# Patient Record
Sex: Male | Born: 1970 | Race: White | Hispanic: No | Marital: Single | Smoking: Never smoker
Health system: Southern US, Community
[De-identification: ages and names within clinical notes are randomized; demographics above are authoritative.]

## PROBLEM LIST (undated history)

## (undated) DIAGNOSIS — I10 Essential (primary) hypertension: Secondary | ICD-10-CM

## (undated) DIAGNOSIS — E785 Hyperlipidemia, unspecified: Secondary | ICD-10-CM

## (undated) HISTORY — DX: Essential (primary) hypertension: I10

## (undated) HISTORY — DX: Hyperlipidemia, unspecified: E78.5

---

## 2007-08-29 ENCOUNTER — Emergency Department (HOSPITAL_COMMUNITY): Admission: EM | Admit: 2007-08-29 | Discharge: 2007-08-29 | Payer: Self-pay | Admitting: Family Medicine

## 2008-07-11 ENCOUNTER — Emergency Department (HOSPITAL_COMMUNITY): Admission: EM | Admit: 2008-07-11 | Discharge: 2008-07-11 | Payer: Self-pay | Admitting: Emergency Medicine

## 2009-06-09 ENCOUNTER — Ambulatory Visit: Payer: Self-pay | Admitting: Family Medicine

## 2009-06-09 DIAGNOSIS — I10 Essential (primary) hypertension: Secondary | ICD-10-CM | POA: Insufficient documentation

## 2009-06-09 DIAGNOSIS — R1319 Other dysphagia: Secondary | ICD-10-CM | POA: Insufficient documentation

## 2009-06-10 LAB — CONVERTED CEMR LAB
AST: 20 units/L (ref 0–37)
Albumin: 3.6 g/dL (ref 3.5–5.2)
Alkaline Phosphatase: 61 units/L (ref 39–117)
Bilirubin, Direct: 0 mg/dL (ref 0.0–0.3)
CO2: 28 meq/L (ref 19–32)
Calcium: 9 mg/dL (ref 8.4–10.5)
GFR calc non Af Amer: 98.86 mL/min (ref >60)
Glucose, Bld: 94 mg/dL (ref 70–99)
HDL: 30.8 mg/dL — ABNORMAL LOW (ref >39.00)
Potassium: 4.2 meq/L (ref 3.5–5.1)
Sodium: 139 meq/L (ref 135–145)
Total CHOL/HDL Ratio: 6
Total Protein: 7.6 g/dL (ref 6.0–8.3)
VLDL: 34 mg/dL (ref 0.0–40.0)

## 2009-06-15 ENCOUNTER — Encounter: Payer: Self-pay | Admitting: Family Medicine

## 2009-06-16 ENCOUNTER — Ambulatory Visit: Payer: Self-pay | Admitting: Otolaryngology

## 2009-07-13 ENCOUNTER — Ambulatory Visit: Payer: Self-pay | Admitting: Family Medicine

## 2009-07-27 ENCOUNTER — Encounter: Payer: Self-pay | Admitting: Family Medicine

## 2009-08-14 ENCOUNTER — Ambulatory Visit: Payer: Self-pay | Admitting: Family Medicine

## 2010-02-15 ENCOUNTER — Ambulatory Visit: Payer: Self-pay | Admitting: Family Medicine

## 2010-02-15 DIAGNOSIS — N63 Unspecified lump in unspecified breast: Secondary | ICD-10-CM | POA: Insufficient documentation

## 2010-02-15 DIAGNOSIS — S335XXA Sprain of ligaments of lumbar spine, initial encounter: Secondary | ICD-10-CM | POA: Insufficient documentation

## 2010-02-23 ENCOUNTER — Encounter: Payer: Self-pay | Admitting: Internal Medicine

## 2010-02-23 ENCOUNTER — Ambulatory Visit: Payer: Self-pay | Admitting: Family Medicine

## 2010-02-23 ENCOUNTER — Telehealth: Payer: Self-pay | Admitting: Family Medicine

## 2010-03-16 ENCOUNTER — Encounter: Payer: Self-pay | Admitting: Family Medicine

## 2010-03-31 ENCOUNTER — Encounter: Payer: Self-pay | Admitting: Family Medicine

## 2010-07-27 NOTE — Consult Note (Signed)
Summary: [facility]  [facility]   Imported By: Lanelle Bal 03/24/2010 09:58:52  _____________________________________________________________________  External Attachment:    Type:   Image     Comment:   External Document  Appended Document: [facility] biopsy of breast mass, results pending.

## 2010-07-27 NOTE — Consult Note (Signed)
Summary: [facility]  [facility]   Imported By: Lanelle Bal 07/03/2009 12:11:53  _____________________________________________________________________  External Attachment:    Type:   Image     Comment:   External Document

## 2010-07-27 NOTE — Letter (Signed)
Summary: [facility]  [facility]   Imported By: Lanelle Bal 08/10/2009 08:57:28  _____________________________________________________________________  External Attachment:    Type:   Image     Comment:   External Document

## 2010-07-27 NOTE — Assessment & Plan Note (Signed)
Summary: 1 MONTH FOLLOWUP BLOOD PRESSURE/[facility]   Vital Signs:  Patient profile:   40 year old male Height:      71 inches Weight:      438.13 pounds BMI:     61.33 Temp:     97.7 degrees F oral Pulse rate:   96 / minute Pulse rhythm:   regular BP sitting:   130 / 86  (left arm) Cuff size:   large  Vitals Entered By: Delilah Shan CMA Duncan Dull) (July 13, 2009 8:50 AM) CC: 1 month follow up - BP   History of Present Illness: 40 yo morbidly obese male here to follow up BP and Dysphagia.  Dysphagia- over last 5 years, feels like food gets stuck at times.  No problems with liquids.  No nausea, vomiting, or cough.  Does not feel like its getting worse, about the same as when it started.  Drinks occasional ETOH, does not smoke cigarettes.  Saw ENT, Dr. Willeen Cass, felt he was having silent reflux.  EGD was otherwise negative.  Started on Zantac which has alleviated this sensation.  Elevated BP- started Prinivil 10 mg daily at last office visit.    No blurred vision, CP, shortness of breath, DOE.  Has been having dry cough since starting it.  Not helped by Zantac.   Current Medications (verified): 1)  Hydrochlorothiazide 25 Mg  Tabs (Hydrochlorothiazide) .... Take 1 Tab By Mouth Every Morning  Allergies (verified): No Known Drug Allergies  Review of Systems      See HPI Eyes:  Denies blurring. CV:  Denies chest pain or discomfort. Resp:  Complains of cough; denies shortness of breath, sputum productive, and wheezing. GI:  Denies abdominal pain.  Physical Exam  General:  morbidly obese, alert, very pleasant. Eyes:  No corneal or conjunctival inflammation noted. EOMI. Perrla. Funduscopic exam benign, without hemorrhages, exudates or papilledema. Vision grossly normal. Mouth:  Oral mucosa and oropharynx without lesions or exudates.  Teeth in good repair. Lungs:  Normal respiratory effort, chest expands symmetrically. Lungs are clear to auscultation, no crackles or wheezes. Heart:   Distant HS RRR Extremities:  No edema Skin:  Intact without suspicious lesions or rashes Psych:  Cognition and judgment appear intact. Alert and cooperative with normal attention span and concentration. No apparent delusions, illusions, hallucinations   Impression & Recommendations:  Problem # 1:  HYPERTENSION (ICD-401.9) Assessment Improved Improved but having a possible allergy to ACEI.  Will add to his allergy list and change to HCTZ.  See him back in one month. The following medications were removed from the medication list:    Prinivil 10 Mg Tabs (Lisinopril) .Marland Kitchen... 1 tab by mouth daily. His updated medication list for this problem includes:    Hydrochlorothiazide 25 Mg Tabs (Hydrochlorothiazide) .Marland Kitchen... Take 1 tab by mouth every morning  Problem # 2:  OTHER DYSPHAGIA (ICD-787.29) Assessment: Improved Continue Zantac.  LIkely related to silent reflux per ENT.  Complete Medication List: 1)  Hydrochlorothiazide 25 Mg Tabs (Hydrochlorothiazide) .... Take 1 tab by mouth every morning Prescriptions: HYDROCHLOROTHIAZIDE 25 MG  TABS (HYDROCHLOROTHIAZIDE) Take 1 tab by mouth every morning  #90 x 3   Entered and Authorized by:   Ruthe Mannan MD   Signed by:   Ruthe Mannan MD on 07/13/2009   Method used:   [facility]  [address] [phone][facility]retail[facility]       [address]       [place], [place]  [address]       Ph: [phone] or [phone]       Fax: [phone]   RxID:   [id number]   Current Allergies (reviewed today): No known allergies

## 2010-07-27 NOTE — Progress Notes (Signed)
Summary: needs corrrected mammogram order  Phone Note From Other Clinic   Caller: Nettie Elm with [facility]   [phone] Summary of Call: Pt is going to mammogram this morning- the order they have needs to be corrected to say diagnostic bilateral mammogram, right breast ultrasound mass, dx code is correct.  They also need a clock postion of the mass.  Please send new mass.  Fax is [phone] Initial call taken by: Lowella Petties CMA,  February 23, 2010 9:13 AM  Follow-up for Phone Call        changed order. Ruthe Mannan MD  February 23, 2010 9:24 AM   Additional Follow-up for Phone Call Additional follow up Details #1::        Faxed corrected order to [facility]. Additional Follow-up by: Carlton Adam,  February 23, 2010 9:32 AM

## 2010-07-27 NOTE — Letter (Signed)
Summary: Mammography Form/[facility]  Mammography [facility]   Imported By: Lanelle Bal 04/06/2010 13:10:31  _____________________________________________________________________  External Attachment:    Type:   Image     Comment:   External Document

## 2010-07-27 NOTE — Assessment & Plan Note (Signed)
Summary: 1 M F/U DLO   Vital Signs:  Patient profile:   40 year old male Height:      71 inches Weight:      435.38 pounds BMI:     60.94 Temp:     98.1 degrees F oral Pulse rate:   88 / minute Pulse rhythm:   regular BP sitting:   134 / 86  (left arm) Cuff size:   large  Vitals Entered By: Delilah Shan CMA Duncan Dull) (August 14, 2009 8:55 AM) CC: follow-up visit   History of Present Illness: 40 yo morbidly obese male here to follow up BP and Dysphagia.  Dysphagia/cough- much improved since stopping the Prinivil and continuing Omeprazole.  Saw ENT at end of January, follow up with them prn. Has had no episodes of feeling like food getting stuck since last OV.  HTN- stopped  Prinivil 10 mg daily and added HCTZ 25 mg at last office visit due to possible ACEI allergy.    No blurred vision, CP, shortness of breath, DOE.  Dry cough completely resolved, allergy to ACEI now on allergy list.  Tolerating HCTZ with no known side effects.  Doing overall well, concerned about job situation.  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  Hopes they will let him transfer to another depeartment and/or work from home.  Current Medications (verified): 1)  Hydrochlorothiazide 25 Mg  Tabs (Hydrochlorothiazide) .... Take 1 Tab By Mouth Every Morning  Allergies (verified): 1)  ! Lisinopril  Review of Systems      See HPI General:  Denies sleep disorder. CV:  Denies chest pain or discomfort, lightheadness, shortness of breath with exertion, swelling of feet, and swelling of hands. Resp:  Denies cough.  Physical Exam  General:  morbidly obese, alert, very pleasant. Mouth:  Oral mucosa and oropharynx without lesions or exudates.  Teeth in good repair. Lungs:  Normal respiratory effort, chest expands symmetrically. Lungs are clear to auscultation, no crackles or wheezes. Heart:  Distant HS RRR Extremities:  No edema Psych:  Cognition and judgment appear intact. Alert and  cooperative with normal attention span and concentration. No apparent delusions, illusions, hallucinations   Impression & Recommendations:  Problem # 1:  HYPERTENSION (ICD-401.9) Assessment Unchanged Stable on HCTZ, now listed ACEI allergy.  His updated medication list for this problem includes:    Hydrochlorothiazide 25 Mg Tabs (Hydrochlorothiazide) .Marland Kitchen... Take 1 tab by mouth every morning  Problem # 2:  OTHER DYSPHAGIA (ICD-787.29) Assessment: Improved Resolved, likely combinations of reflux and allergy to ACEI.  Complete Medication List: 1)  Hydrochlorothiazide 25 Mg Tabs (Hydrochlorothiazide) .... Take 1 tab by mouth every morning Prescriptions: HYDROCHLOROTHIAZIDE 25 MG  TABS (HYDROCHLOROTHIAZIDE) Take 1 tab by mouth every morning  #90 x 6   Entered and Authorized by:   Ruthe Mannan MD   Signed by:   Ruthe Mannan MD on 08/14/2009   Method used:   [facility]        [facility]  [address] (retail)       [address]       [place], [place]  [address]       Ph: [phone] or [phone]       Fax: [phone]   RxID:   [id number]   Current Allergies (reviewed today): ! LISINOPRIL

## 2010-07-27 NOTE — Assessment & Plan Note (Signed)
Summary: 6 M F/U DLO   Vital Signs:  Patient profile:   40 year old male Height:      71 inches Weight:      134.50 pounds BMI:     18.83 Temp:     97.8 degrees F oral Pulse rate:   84 / minute Pulse rhythm:   regular BP sitting:   130 / 72  (right arm) Cuff size:   large  Vitals Entered By: Linde Gillis CMA Duncan Dull) (February 15, 2010 8:51 AM) CC: 6 month follow up   History of Present Illness: 40 yo morbidly obese male here for 6 month follow up.  HTN- stopped  Prinivil 10 mg daily and added HCTZ 25 mg due to possible ACEI allergy.    No blurred vision, CP, shortness of breath, DOE.  Dry cough completely resolved, allergy to ACEI now on allergy list.  Tolerating HCTZ with no known side effects.  Breast mass- noticed a painful mass under his right nipple a few months ago.  Feels it is growing in size.  No changes or discharge from his nipples.  Weight gain- continues to gain weight.  Thinking about lap band but needs more time.  Trying to work on portion control.  Lumbar strain- strained his back a few weeks ago by turning quickily a certain way.  Symptoms improved but at night, still very tight lower back.  Radicular symptoms have resolved.  No urinary symtpoms.  Current Medications (verified): 1)  Hydrochlorothiazide 25 Mg  Tabs (Hydrochlorothiazide) .... Take 1 Tab By Mouth Every Morning 2)  Cyclobenzaprine Hcl 10 Mg  Tabs (Cyclobenzaprine Hcl) .Marland Kitchen.. 1 By Mouth 2 Times Daily As Needed For Back Pain  Allergies: 1)  ! Lisinopril  Past History:  Past Medical History: Last updated: 06/09/2009 Morbid Obesity. Hypertension  Past Surgical History: Last updated: 06/09/2009 Denies surgical history  Family History: Last updated: 06/09/2009 Mom - HTN Dad- HTN, h/o prostate CA at age 63  Social History: Last updated: 06/09/2009 Lives alone in [place].  Single. Never Smoked Alcohol use-yes Drug use-no Regular exercise-no  Risk Factors: Exercise: no  (06/09/2009)  Risk Factors: Smoking Status: never (06/09/2009)  Review of Systems      See HPI General:  Denies fever. Eyes:  Denies blurring. ENT:  Denies difficulty swallowing. CV:  Denies chest pain or discomfort. Resp:  Denies shortness of breath. MS:  Complains of low back pain and stiffness; denies loss of strength, muscle weakness, and thoracic pain. Neuro:  Denies visual disturbances.  Physical Exam  General:  morbidly obese, alert, very pleasant. Mouth:  Oral mucosa and oropharynx without lesions or exudates.  Teeth in good repair. Breasts:  R palpable mass under aerola (6 oclock), TTP.  no adenopathy, no nipple discharge, and R palpable mass.   Lungs:  Normal respiratory effort, chest expands symmetrically. Lungs are clear to auscultation, no crackles or wheezes. Heart:  Distant HS RRR Msk:  tight lumbar paraspinous muscles, SLR neg bilaterally, normal strength and sensation Extremities:  no edema Neurologic:  alert & oriented X3 and gait normal.   Psych:  Cognition and judgment appear intact. Alert and cooperative with normal attention span and concentration. No apparent delusions, illusions, hallucinations   Impression & Recommendations:  Problem # 1:  LUMP OR MASS IN BREAST (ICD-611.72) Assessment New New mass, will send for mammogram. Orders: Radiology Referral (Radiology)  Problem # 2:  HYPERTENSION (ICD-401.9) Assessment: Improved Stable on HCTZ, continue current dose. His updated medication list for this problem  includes:    Hydrochlorothiazide 25 Mg Tabs (Hydrochlorothiazide) .Marland Kitchen... Take 1 tab by mouth every morning  Problem # 3:  LUMBAR STRAIN, ACUTE (ICD-847.2) Assessment: New Discussed supportive care. Flexeril as needed at night as needed.  Complete Medication List: 1)  Hydrochlorothiazide 25 Mg Tabs (Hydrochlorothiazide) .... Take 1 tab by mouth every morning 2)  Cyclobenzaprine Hcl 10 Mg Tabs (Cyclobenzaprine hcl) .Marland Kitchen.. 1 by mouth 2 times daily  as needed for back pain  Patient Instructions: 1)  Please stop by to see Shirlee Limerick on your way out. 2)  Most patients (90%) with low back pain will improve with time ( 2-6 weeks). Keep active but avoid activities that are painful. Apply moist heat and/or ice to lower back several times a day.  Prescriptions: CYCLOBENZAPRINE HCL 10 MG  TABS (CYCLOBENZAPRINE HCL) 1 by mouth 2 times daily as needed for back pain  #20 x 0   Entered and Authorized by:   Ruthe Mannan MD   Signed by:   Ruthe Mannan MD on 02/15/2010   Method used:   [facility]        [facility]  [address] (retail)       [address]       [place], [place]  [address]       Ph: [phone] or [phone]       Fax: [phone]   RxID:   [id number]   Current Allergies (reviewed today): ! LISINOPRIL

## 2010-08-04 ENCOUNTER — Other Ambulatory Visit: Payer: Self-pay | Admitting: Family Medicine

## 2010-08-04 ENCOUNTER — Encounter: Payer: Self-pay | Admitting: Family Medicine

## 2010-08-04 ENCOUNTER — Encounter (INDEPENDENT_AMBULATORY_CARE_PROVIDER_SITE_OTHER): Payer: BC Managed Care – PPO | Admitting: Family Medicine

## 2010-08-04 DIAGNOSIS — E785 Hyperlipidemia, unspecified: Secondary | ICD-10-CM

## 2010-08-04 DIAGNOSIS — Z125 Encounter for screening for malignant neoplasm of prostate: Secondary | ICD-10-CM

## 2010-08-04 DIAGNOSIS — Z136 Encounter for screening for cardiovascular disorders: Secondary | ICD-10-CM

## 2010-08-04 DIAGNOSIS — Z0000 Encounter for general adult medical examination without abnormal findings: Secondary | ICD-10-CM

## 2010-08-04 DIAGNOSIS — N62 Hypertrophy of breast: Secondary | ICD-10-CM | POA: Insufficient documentation

## 2010-08-04 DIAGNOSIS — I10 Essential (primary) hypertension: Secondary | ICD-10-CM

## 2010-08-04 LAB — LIPID PANEL
Cholesterol: 206 mg/dL — ABNORMAL HIGH (ref 0–200)
Total CHOL/HDL Ratio: 5
Triglycerides: 171 mg/dL — ABNORMAL HIGH (ref 0.0–149.0)

## 2010-08-04 LAB — BASIC METABOLIC PANEL
BUN: 10 mg/dL (ref 6–23)
CO2: 27 mEq/L (ref 19–32)
Calcium: 9.2 mg/dL (ref 8.4–10.5)
Chloride: 105 mEq/L (ref 96–112)
Creatinine, Ser: 0.8 mg/dL (ref 0.4–1.5)

## 2010-08-12 NOTE — Assessment & Plan Note (Signed)
Summary: cpe jrt   Vital Signs:  Patient profile:   40 year old male Height:      71 inches Weight:      463.50 pounds BMI:     64.88 Temp:     98.5 degrees F oral Pulse rate:   102 / minute Pulse rhythm:   regular BP sitting:   130 / 80  (right arm) Cuff size:   large  Vitals Entered By: Linde Gillis CMA Duncan Dull) (August 04, 2010 8:28 AM) CC: complete physicial   History of Present Illness: 40 yo morbidly obese male here forCPE.    HTN- stable on HCTZ 25 mg daily.    No blurred vision, CP, shortness of breath, DOE.  Dry cough completely resolved, allergy to ACEI now on allergy list.  Tolerating HCTZ with no known side effects.  Breast mass- noticed a painful mass under his right nipple a few months ago. Underwent ultrasound and breast biopsy, benign gynecomastia.  Weight gain- continues to gain weight.  Thinking about lap band and is actually attending a seminar by [employer] next week.      Dad had Prostate CA later in life.  Pt denies any increased urinary frequency or urgency.  Current Medications (verified): 1)  Hydrochlorothiazide 25 Mg  Tabs (Hydrochlorothiazide) .... Take 1 Tab By Mouth Every Morning  Allergies: 1)  ! Lisinopril  Past History:  Past Medical History: Last updated: 06/09/2009 Morbid Obesity. Hypertension  Past Surgical History: Last updated: 06/09/2009 Denies surgical history  Family History: Last updated: 08/04/2010 Mom - HTN Dad- HTN, h/o prostate CA at age 7 Family History of Prostate CA 1st degree relative <50  Social History: Last updated: 06/09/2009 Lives alone in [place].  Single. Never Smoked Alcohol use-yes Drug use-no Regular exercise-no  Risk Factors: Exercise: no (06/09/2009)  Risk Factors: Smoking Status: never (06/09/2009)  Family History: Mom - HTN Dad- HTN, h/o prostate CA at age 62 Family History of Prostate CA 1st degree relative <50  Review of Systems      See HPI General:  Denies malaise. Eyes:  Denies  blurring. ENT:  Denies difficulty swallowing. CV:  Denies chest pain or discomfort. Resp:  Denies shortness of breath. GI:  Denies abdominal pain, bloody stools, and change in bowel habits. GU:  Denies incontinence, urinary frequency, and urinary hesitancy. MS:  Complains of low back pain. Derm:  Denies rash. Neuro:  Denies headaches. Psych:  Denies anxiety and depression. Endo:  Denies cold intolerance and heat intolerance. Heme:  Denies abnormal bruising and bleeding.  Physical Exam  General:  morbidly obese, alert, very pleasant. Head:  normocephalic, atraumatic, and no abnormalities observed.   Eyes:  vision grossly intact, pupils equal, and pupils round.   Ears:  R ear normal and L ear normal.   Nose:  no external deformity.   Mouth:  good dentition.   Lungs:  Normal respiratory effort, chest expands symmetrically. Lungs are clear to auscultation, no crackles or wheezes. Heart:  Distant HS RRR Abdomen:  obese, soft, NT Msk:  No deformity or scoliosis noted of thoracic or lumbar spine.   Extremities:  no edema Neurologic:  alert & oriented X3 and gait normal.   Skin:  Intact without suspicious lesions or rashes Psych:  Cognition and judgment appear intact. Alert and cooperative with normal attention span and concentration. No apparent delusions, illusions, hallucinations   Impression & Recommendations:  Problem # 1:  Preventive Health Care (ICD-V70.0) Reviewed preventive care protocols, scheduled due services, and  updated immunizations Discussed nutrition, exercise, diet, and healthy lifestyle.  FLP, BMET, PSA (family h/o prostate CA) today.  Problem # 2:  HYPERTENSION (ICD-401.9) Assessment: Unchanged stable on HCTZ 25 mg daily. His updated medication list for this problem includes:    Hydrochlorothiazide 25 Mg Tabs (Hydrochlorothiazide) .Marland Kitchen... Take 1 tab by mouth every morning  Problem # 3:  OBESITY, MORBID (ICD-278.01) Assessment: Deteriorated Continues to  deteriorate.  Pt will go to seminar for bariatric surgery and let me know about what he decides.  Complete Medication List: 1)  Hydrochlorothiazide 25 Mg Tabs (Hydrochlorothiazide) .... Take 1 tab by mouth every morning  Other Orders: TLB-Lipid Panel (80061-LIPID) TLB-BMP (Basic Metabolic Panel-BMET) (80048-METABOL) Venipuncture (16109) TLB-PSA (Prostate Specific Antigen) (84153-PSA)   Orders Added: 1)  TLB-Lipid Panel [80061-LIPID] 2)  TLB-BMP (Basic Metabolic Panel-BMET) [80048-METABOL] 3)  Venipuncture [60454] 4)  TLB-PSA (Prostate Specific Antigen) [84153-PSA] 5)  Est. Patient 18-39 years [99395]    Current Allergies (reviewed today): ! LISINOPRIL  Prevention & Chronic Care Immunizations   Influenza vaccine: Not documented    Tetanus booster: 06/09/2009: Tdap    Pneumococcal vaccine: Not documented  Other Screening   Smoking status: never  (06/09/2009)  Lipids   Total Cholesterol: 191  (06/09/2009)   Lipid panel action/deferral: Lipid Panel ordered   LDL: 126  (06/09/2009)   LDL Direct: Not documented   HDL: 30.80  (06/09/2009)   Triglycerides: 170.0  (06/09/2009)  Hypertension   Last Blood Pressure: 130 / 80  (08/04/2010)   Serum creatinine: 0.9  (06/09/2009)   BMP action: Ordered   Serum potassium 4.2  (06/09/2009)  Self-Management Support :    Hypertension self-management support: Not documented

## 2010-09-08 ENCOUNTER — Encounter: Payer: Self-pay | Admitting: *Deleted

## 2010-09-08 ENCOUNTER — Other Ambulatory Visit (INDEPENDENT_AMBULATORY_CARE_PROVIDER_SITE_OTHER): Payer: BC Managed Care – PPO

## 2010-09-08 ENCOUNTER — Other Ambulatory Visit: Payer: Self-pay | Admitting: Family Medicine

## 2010-09-08 DIAGNOSIS — E785 Hyperlipidemia, unspecified: Secondary | ICD-10-CM

## 2010-09-08 LAB — LIPID PANEL
HDL: 33.6 mg/dL — ABNORMAL LOW (ref >39.00)
LDL Cholesterol: 87 mg/dL (ref 0–99)
Total CHOL/HDL Ratio: 4
VLDL: 28.4 mg/dL (ref 0.0–40.0)

## 2010-09-08 LAB — BASIC METABOLIC PANEL
BUN: 12 mg/dL (ref 6–23)
CO2: 26 mEq/L (ref 19–32)
Chloride: 101 mEq/L (ref 96–112)
GFR: 138.13 mL/min (ref >60.00)
Glucose, Bld: 93 mg/dL (ref 70–99)
Potassium: 3.9 mEq/L (ref 3.5–5.1)
Sodium: 136 mEq/L (ref 135–145)

## 2010-09-08 LAB — HEPATIC FUNCTION PANEL
Bilirubin, Direct: 0.1 mg/dL (ref 0.0–0.3)
Total Bilirubin: 0.4 mg/dL (ref 0.3–1.2)

## 2010-09-24 ENCOUNTER — Other Ambulatory Visit: Payer: BC Managed Care – PPO

## 2010-10-06 ENCOUNTER — Encounter: Payer: Self-pay | Admitting: Family Medicine

## 2010-10-06 ENCOUNTER — Telehealth: Payer: Self-pay | Admitting: *Deleted

## 2010-10-06 NOTE — Telephone Encounter (Signed)
Pt has brought in a form for surgical clearance for lapband surgery.  Form is on your desk.

## 2010-10-06 NOTE — Telephone Encounter (Signed)
Patient advised that form and letter ready for pick up, he will stop by today or tomorrow.

## 2010-10-06 NOTE — Telephone Encounter (Signed)
Complete, in my box.

## 2010-10-25 ENCOUNTER — Ambulatory Visit: Payer: Self-pay

## 2010-10-25 DIAGNOSIS — Z0279 Encounter for issue of other medical certificate: Secondary | ICD-10-CM

## 2010-11-29 ENCOUNTER — Other Ambulatory Visit (INDEPENDENT_AMBULATORY_CARE_PROVIDER_SITE_OTHER): Payer: Self-pay | Admitting: Surgery

## 2010-12-08 ENCOUNTER — Ambulatory Visit (HOSPITAL_COMMUNITY)
Admission: RE | Admit: 2010-12-08 | Discharge: 2010-12-08 | Disposition: A | Discharge status: Home / Self Care | Payer: BC Managed Care – PPO | Source: Ambulatory Visit | Attending: Surgery | Admitting: Surgery

## 2010-12-08 DIAGNOSIS — Z01818 Encounter for other preprocedural examination: Secondary | ICD-10-CM | POA: Insufficient documentation

## 2010-12-08 DIAGNOSIS — K7689 Other specified diseases of liver: Secondary | ICD-10-CM | POA: Insufficient documentation

## 2010-12-08 DIAGNOSIS — E669 Obesity, unspecified: Secondary | ICD-10-CM | POA: Insufficient documentation

## 2010-12-08 DIAGNOSIS — M47814 Spondylosis without myelopathy or radiculopathy, thoracic region: Secondary | ICD-10-CM | POA: Insufficient documentation

## 2010-12-08 DIAGNOSIS — Z01810 Encounter for preprocedural cardiovascular examination: Secondary | ICD-10-CM | POA: Insufficient documentation

## 2010-12-14 ENCOUNTER — Ambulatory Visit (HOSPITAL_COMMUNITY)
Admission: RE | Admit: 2010-12-14 | Discharge: 2010-12-14 | Disposition: A | Discharge status: Home / Self Care | Payer: BC Managed Care – PPO | Source: Ambulatory Visit | Attending: Surgery | Admitting: Surgery

## 2010-12-14 DIAGNOSIS — Z6841 Body Mass Index (BMI) 40.0 and over, adult: Secondary | ICD-10-CM | POA: Insufficient documentation

## 2010-12-14 DIAGNOSIS — Z01818 Encounter for other preprocedural examination: Secondary | ICD-10-CM | POA: Insufficient documentation

## 2010-12-15 ENCOUNTER — Other Ambulatory Visit (INDEPENDENT_AMBULATORY_CARE_PROVIDER_SITE_OTHER): Payer: Self-pay | Admitting: Surgery

## 2010-12-15 LAB — CBC WITH DIFFERENTIAL/PLATELET
Eosinophils Absolute: 0.4 10*3/uL (ref 0.0–0.7)
Eosinophils Relative: 5 % (ref 0–5)
Lymphs Abs: 2.8 10*3/uL (ref 0.7–4.0)
MCH: 29.3 pg (ref 26.0–34.0)
MCHC: 33.5 g/dL (ref 30.0–36.0)
MCV: 87.6 fL (ref 78.0–100.0)
Platelets: 307 10*3/uL (ref 150–400)
RBC: 4.98 MIL/uL (ref 4.22–5.81)
RDW: 15.6 % — ABNORMAL HIGH (ref 11.5–15.5)

## 2010-12-15 LAB — COMPREHENSIVE METABOLIC PANEL
ALT: 29 U/L (ref 0–53)
CO2: 24 mEq/L (ref 19–32)
Creat: 0.84 mg/dL (ref 0.50–1.35)
Total Bilirubin: 0.4 mg/dL (ref 0.3–1.2)

## 2010-12-15 LAB — LIPID PANEL
Cholesterol: 172 mg/dL (ref 0–200)
Total CHOL/HDL Ratio: 4.4 Ratio
Triglycerides: 122 mg/dL (ref <150)
VLDL: 24 mg/dL (ref 0–40)

## 2010-12-15 LAB — TSH: TSH: 2.825 u[IU]/mL (ref 0.350–4.500)

## 2010-12-21 ENCOUNTER — Encounter: Payer: BC Managed Care – PPO | Attending: Surgery | Admitting: *Deleted

## 2010-12-21 ENCOUNTER — Ambulatory Visit: Payer: BC Managed Care – PPO | Admitting: *Deleted

## 2010-12-21 DIAGNOSIS — Z713 Dietary counseling and surveillance: Secondary | ICD-10-CM | POA: Insufficient documentation

## 2010-12-21 DIAGNOSIS — Z01818 Encounter for other preprocedural examination: Secondary | ICD-10-CM | POA: Insufficient documentation

## 2011-02-03 ENCOUNTER — Encounter: Payer: BC Managed Care – PPO | Attending: Surgery

## 2011-02-03 DIAGNOSIS — Z9884 Bariatric surgery status: Secondary | ICD-10-CM | POA: Insufficient documentation

## 2011-02-03 DIAGNOSIS — Z713 Dietary counseling and surveillance: Secondary | ICD-10-CM | POA: Insufficient documentation

## 2011-02-03 DIAGNOSIS — Z09 Encounter for follow-up examination after completed treatment for conditions other than malignant neoplasm: Secondary | ICD-10-CM | POA: Insufficient documentation

## 2011-02-03 NOTE — Progress Notes (Signed)
  Pre-Operative Nutrition Class  Patient was seen on 02/03/2011 for Pre-Operative Nutrition education at [facility].   Surgery date: 03/01/11 Surgery type: LAGB  Weight today: 430 lbs Weight change: 17.9 lbs lost Total weight lost: 17.9 lbs BMI: 59.9%  Samples given per MNT protocol: Bariatric Advantage Multivitamin Lot # [id number] Exp: 12/12  Bariatric Advantage Calcium Citrate Lot # [id number] Exp: 9/13  Celebrate Vitamins Multivitamin Lot # [id number] Exp: 9/13  Celebrate VitaminsCalcium Citrate Lot #[id number] Exp: 4/13  The following the learning objective met by the patient during this course:   Identifies Pre-Op Dietary Goals and will begin 2 weeks pre-operatively   Identifies appropriate sources of fluids and proteins   States protein recommendations and appropriate sources pre and post-operatively  Identifies Post-Operative Dietary Goals and will follow for 2 weeks post-operatively  Identifies appropriate multivitamin and calcium sources  Describes the need for physical activity post-operatively and will follow MD recommendations  States when to call healthcare provider regarding medication questions or post-operative complications  Follow-Up Plan: Patient will follow-up at [facility] 2 weeks post operatively for diet advancement per MD.

## 2011-02-15 ENCOUNTER — Encounter (INDEPENDENT_AMBULATORY_CARE_PROVIDER_SITE_OTHER): Payer: Self-pay | Admitting: General Surgery

## 2011-02-16 ENCOUNTER — Ambulatory Visit (INDEPENDENT_AMBULATORY_CARE_PROVIDER_SITE_OTHER): Payer: BC Managed Care – PPO | Admitting: Surgery

## 2011-02-16 ENCOUNTER — Encounter (INDEPENDENT_AMBULATORY_CARE_PROVIDER_SITE_OTHER): Payer: Self-pay | Admitting: Surgery

## 2011-02-16 NOTE — Progress Notes (Signed)
Subjective:     Patient ID: Timothy Delacruz, male   DOB: 11/06/1970, 40 y.o.   MRN: [id number]  HPI  Timothy Delacruz comes in today for his preoperative visit. Today his BMI is 59 and his weight is 423. His primary care doctor is Dr.Aron at [facility]. He has been doing a lot of reading about the leg pain and has no further questions today. He has hypertension which is under treatment he currently is seeing a chiropractor for low back pain.  Prior surgery none  Current medications hydrochlorothiazide, omeprazole, multivitamins  Patient is a Occupational psychologist for [employer]. He is single and he does not smoke or drink. Current Outpatient Prescriptions  Medication Sig Dispense Refill  . hydrochlorothiazide 25 MG tablet Take 25 mg by mouth daily.        Marland Kitchen omeprazole (PRILOSEC) 20 MG capsule Take 20 mg by mouth daily.         Past Medical History  Diagnosis Date  . Hyperlipidemia   . Hypertension    No past surgical history on file.   Review of Systems  Constitutional: Negative.   HENT: Negative.   Eyes: Negative.   Respiratory: Negative.   Cardiovascular: Negative.   Gastrointestinal: Negative.   Genitourinary: Negative.   Musculoskeletal: Negative.   Neurological: Negative.   Hematological: Negative.   Psychiatric/Behavioral: Negative.        Objective:   Physical Exam  Constitutional: He is oriented to person, place, and time. He appears well-developed and well-nourished.  HENT:  Head: Normocephalic and atraumatic.  Eyes: Conjunctivae and EOM are normal. Pupils are equal, round, and reactive to light.  Neck: Normal range of motion. Neck supple.  Cardiovascular: Normal rate, regular rhythm and normal heart sounds.   Pulmonary/Chest: Effort normal and breath sounds normal.  Abdominal: Soft. Bowel sounds are normal.  Musculoskeletal: Normal range of motion.  Neurological: He is alert and oriented to person, place, and time.  Skin: Skin is warm  and dry.  Psychiatric: He has a normal mood and affect. His behavior is normal. Judgment and thought content normal.       Assessment:     Morbid obesity.  Doing well on prep diet    Plan:     Lapband on Sept 4

## 2011-02-16 NOTE — Patient Instructions (Signed)
Stay on preop low carb high protein diet

## 2011-02-22 ENCOUNTER — Other Ambulatory Visit (INDEPENDENT_AMBULATORY_CARE_PROVIDER_SITE_OTHER): Payer: Self-pay | Admitting: Surgery

## 2011-02-22 ENCOUNTER — Encounter (HOSPITAL_COMMUNITY): Payer: BC Managed Care – PPO

## 2011-02-22 LAB — COMPREHENSIVE METABOLIC PANEL
ALT: 30 U/L (ref 0–53)
Albumin: 3.9 g/dL (ref 3.5–5.2)
Alkaline Phosphatase: 81 U/L (ref 39–117)
CO2: 26 mEq/L (ref 19–32)
Calcium: 9.5 mg/dL (ref 8.4–10.5)
Chloride: 98 mEq/L (ref 96–112)
Creatinine, Ser: 0.71 mg/dL (ref 0.50–1.35)
GFR calc Af Amer: >60 mL/min (ref >60)
GFR calc non Af Amer: >60 mL/min (ref >60)
Glucose, Bld: 89 mg/dL (ref 70–99)
Sodium: 137 mEq/L (ref 135–145)
Total Bilirubin: 0.4 mg/dL (ref 0.3–1.2)

## 2011-02-22 LAB — CBC
HCT: 42.8 % (ref 39.0–52.0)
MCH: 29.2 pg (ref 26.0–34.0)
MCHC: 34.3 g/dL (ref 30.0–36.0)
MCV: 85.1 fL (ref 78.0–100.0)
RDW: 14.2 % (ref 11.5–15.5)

## 2011-02-22 LAB — DIFFERENTIAL
Basophils Absolute: 0 10*3/uL (ref 0.0–0.1)
Basophils Relative: 1 % (ref 0–1)
Eosinophils Absolute: 0.2 10*3/uL (ref 0.0–0.7)
Eosinophils Relative: 2 % (ref 0–5)
Monocytes Absolute: 0.6 10*3/uL (ref 0.1–1.0)

## 2011-02-26 HISTORY — PX: LAPAROSCOPIC GASTRIC BANDING: SHX1100

## 2011-03-01 ENCOUNTER — Ambulatory Visit (HOSPITAL_COMMUNITY)
Admission: RE | Admit: 2011-03-01 | Discharge: 2011-03-02 | Disposition: A | Discharge status: Home / Self Care | Payer: BC Managed Care – PPO | Source: Ambulatory Visit | Attending: Surgery | Admitting: Surgery

## 2011-03-01 DIAGNOSIS — Z6841 Body Mass Index (BMI) 40.0 and over, adult: Secondary | ICD-10-CM

## 2011-03-01 DIAGNOSIS — K219 Gastro-esophageal reflux disease without esophagitis: Secondary | ICD-10-CM | POA: Insufficient documentation

## 2011-03-01 DIAGNOSIS — I10 Essential (primary) hypertension: Secondary | ICD-10-CM | POA: Insufficient documentation

## 2011-03-01 DIAGNOSIS — Z01812 Encounter for preprocedural laboratory examination: Secondary | ICD-10-CM | POA: Insufficient documentation

## 2011-03-02 ENCOUNTER — Ambulatory Visit (HOSPITAL_COMMUNITY): Payer: BC Managed Care – PPO

## 2011-03-02 DIAGNOSIS — Z9889 Other specified postprocedural states: Secondary | ICD-10-CM

## 2011-03-02 LAB — DIFFERENTIAL
Basophils Absolute: 0 10*3/uL (ref 0.0–0.1)
Basophils Relative: 0 % (ref 0–1)
Eosinophils Relative: 1 % (ref 0–5)
Lymphocytes Relative: 21 % (ref 12–46)

## 2011-03-02 LAB — CBC
HCT: 40.3 % (ref 39.0–52.0)
Platelets: 301 10*3/uL (ref 150–400)
RDW: 14.9 % (ref 11.5–15.5)
WBC: 9.5 10*3/uL (ref 4.0–10.5)

## 2011-03-02 NOTE — Op Note (Signed)
  NAMEMarland Delacruz  MOLLY, MASELLI NO.:  [id number]  MEDICAL RECORD NO.:  [id number]  LOCATION:  1535                         FACILITY:  [facility]  PHYSICIAN:  Thornton Park. Daphine Deutscher, MD  DATE OF BIRTH:  Jul 05, 1970  DATE OF PROCEDURE:  03/01/2011 DATE OF DISCHARGE:                              OPERATIVE REPORT   PREOPERATIVE DIAGNOSIS:  Morbid obesity, body mass index 59.  PROCEDURES:  Laparoscopic adjustable gastric band (Allergan APL system) and negative balloon test for hiatal hernia.  SURGEON:  Thornton Park. Daphine Deutscher, MD  ASSISTANT:  Sandria Bales. Ezzard Standing, M.D.  ANESTHESIA:  General endotracheal.  DESCRIPTION OF PROCEDURE:  This 40 year old white male was taken to room 1 at [facility] on Tuesday, March 01, 2011, given general anesthesia.  The abdomen was prepped with PCMX and draped sterilely.  Access to the abdomen was achieved through the left upper quadrant after appropriate timeout was performed.  Abdomen was insufflated and standard trocar placements were used including the 15 in the right upper quadrant placed obliquely, 5 in the upper midline for the liver retractor, and balloon-tip applied medical device was placed to the left and right of midline.  With the liver retracted, I did not see a dimple.  I went ahead and put a balloon-tip catheter down with 10 cc of air, had held up appropriately at the GE junction.  We therefore saw no mechanical sliding hiatal hernia to repair.  This corroborated the evidence of the upper GI which did not see a hiatal hernia.  I then went along on the right side and found the fat stripe, created a plane for the band passer, and created another site over on the left side for an exit site.  Band passer went through easily.  An APL band was inserted, brought around the upper foregut without difficulty, and buckled over the sizing tubing.  It was then plicated with 3 sutures using Surgidac.  The first portion probably got a  little bit of the diaphragm as well.  The band was anchored nicely and had a good lie to it.  The tubing was brought to the lower port on the right and secured to a subcutaneously-placed port that had Bard mesh on the back.  Wounds were then injected with Exparel and were closed with 4-0 Vicryl, Benzoin, Steri-Strips.  The patient tolerated the procedure well, was taken to recovery room in satisfactory condition.     Thornton Park Daphine Deutscher, MD     MBM/MEDQ  D:  03/01/2011  T:  03/01/2011  Job:  [id number]  cc:   Ruthe Mannan, M.D. Fax: [phone]  Electronically Signed by Luretha Murphy MD on 03/02/2011 01:39:40 PM

## 2011-03-14 ENCOUNTER — Ambulatory Visit (INDEPENDENT_AMBULATORY_CARE_PROVIDER_SITE_OTHER): Payer: BC Managed Care – PPO | Admitting: Family Medicine

## 2011-03-14 ENCOUNTER — Encounter: Payer: Self-pay | Admitting: Family Medicine

## 2011-03-14 ENCOUNTER — Other Ambulatory Visit: Payer: Self-pay | Admitting: Family Medicine

## 2011-03-14 VITALS — BP 122/80 | HR 67 | Temp 97.6°F | Wt 399.2 lb

## 2011-03-14 DIAGNOSIS — L909 Atrophic disorder of skin, unspecified: Secondary | ICD-10-CM

## 2011-03-14 DIAGNOSIS — L918 Other hypertrophic disorders of the skin: Secondary | ICD-10-CM

## 2011-03-14 NOTE — Progress Notes (Signed)
Addended by: Gilmer Mor on: 03/14/2011 04:45 PM   Modules accepted: Orders

## 2011-03-14 NOTE — Progress Notes (Signed)
    40 yo here for removal of multiple skin tags. They have been there for years but some are starting to irritate him.  Just had lap band on 03/03/2011. Doing well, has already lost significant amount of weight.  Still on liquid diet.  Wt Readings from Last 3 Encounters:  03/14/11 399 lb 4 oz (181.099 kg)  02/16/11 423 lb 6 oz (192.042 kg)  02/03/11 430 lb (195.047 kg)      ROS: See HPI  Patient Active Problem List  Diagnoses  . OBESITY, MORBID  . ESSENTIAL HYPERTENSION, BENIGN  . HYPERTENSION  . OTHER DYSPHAGIA  . LUMBAR STRAIN, ACUTE  . GYNECOMASTIA, UNILATERAL  . HYPERLIPIDEMIA  . Cutaneous skin tags   Past Medical History  Diagnosis Date  . Hyperlipidemia   . Hypertension    Past Surgical History  Procedure Date  . Laparoscopic gastric banding 02/2011   History  Substance Use Topics  . Smoking status: Never Smoker   . Smokeless tobacco: Not on file  . Alcohol Use: Yes   No family history on file. Allergies  Allergen Reactions  . Lisinopril     REACTION: cough   Current Outpatient Prescriptions on File Prior to Visit  Medication Sig Dispense Refill  . hydrochlorothiazide 25 MG tablet Take 25 mg by mouth daily.        Marland Kitchen omeprazole (PRILOSEC) 20 MG capsule Take 20 mg by mouth daily.         The PMH, PSH, Social History, Family History, Medications, and allergies have been reviewed in [facility], and have been updated if relevant.  The PMH, PSH, Social History, Family History, Medications, and allergies have been reviewed in [facility], and have been updated if relevant.     Skin Tag Removal Procedure Note  Pre-operative Diagnosis: Classic skin tags (acrochordon)  Post-operative Diagnosis: Classic skin tags (acrochordon)  Locations:under axilla bilaterally   Indications: irritation  Anesthesia: lidocaine with epi  Procedure Details  The risks (including bleeding and infection) and benefits of the procedure and Written informed consent obtained. Using  sterile iris scissors, multiple skin tags were snipped off at their bases after cleansing with Betadine.  Bleeding was controlled by pressure.   Findings: Pathognomonic benign lesions  not sent for pathological exam.  Condition: Stable  Complications: none.  Plan: 1. Instructed to keep the wounds dry and covered for 24-48h and clean thereafter. 2. Warning signs of infection were reviewed.   3. Return as needed.

## 2011-03-14 NOTE — Patient Instructions (Signed)
Good to see you!  You look great! Please keep area as clean for next 24-48 hours as discussed. If you develop any redness or signs of infection, please let us know immediately.

## 2011-03-15 ENCOUNTER — Encounter: Payer: BC Managed Care – PPO | Attending: Surgery | Admitting: *Deleted

## 2011-03-15 DIAGNOSIS — Z09 Encounter for follow-up examination after completed treatment for conditions other than malignant neoplasm: Secondary | ICD-10-CM | POA: Insufficient documentation

## 2011-03-15 DIAGNOSIS — Z9884 Bariatric surgery status: Secondary | ICD-10-CM | POA: Insufficient documentation

## 2011-03-15 DIAGNOSIS — Z713 Dietary counseling and surveillance: Secondary | ICD-10-CM | POA: Insufficient documentation

## 2011-03-15 NOTE — Patient Instructions (Signed)
Patient to follow Phase 3A-Soft, High Protein Diet and follow-up at [facility] in 6 weeks for 2 months post-op nutrition visit for diet advancement. 

## 2011-03-15 NOTE — Progress Notes (Signed)
  2 Week Post-Operative Nutrition Class  Patient was seen on 03/15/2011 for Post-Operative Nutrition education at [facility].   Surgery date: 03/01/11 Surgery type: LAGB  Weight today: 397.6 lbs Weight change: 32.4 lbs Total weight lost: 50.3 lbs BMI: 55.6%  The following the learning objective met the patient during this course:   Identifies Phase 3A (Soft, High Proteins) Dietary Goals and will begin from 2 weeks post-operatively to 2 months post-operatively   Identifies appropriate sources of fluids and proteins   States protein recommendations and appropriate sources post-operatively  Identifies the need for appropriate texture modifications, mastication, and bite sizes when consuming solids  Identifies appropriate multivitamin and calcium sources post-operatively  Describes the need for physical activity post-operatively and will follow MD recommendations  States when to call healthcare provider regarding medication questions or post-operative complications  Handouts given during class include:  Phase 3A: Soft, High Protein Diet Handout  Band Fill Guidelines Handout  Follow-Up Plan: Patient will follow-up at [facility] in 6 weeks for 2 months post-op nutrition visit for diet advancement per MD.

## 2011-03-17 ENCOUNTER — Ambulatory Visit (INDEPENDENT_AMBULATORY_CARE_PROVIDER_SITE_OTHER): Payer: BC Managed Care – PPO | Admitting: Surgery

## 2011-03-17 ENCOUNTER — Encounter (INDEPENDENT_AMBULATORY_CARE_PROVIDER_SITE_OTHER): Payer: Self-pay | Admitting: Surgery

## 2011-03-17 NOTE — Progress Notes (Signed)
Timothy Delacruz comes in today and he is 16 days postop. His incisions have healed nicely. He is going to return to work on 926 2012.Marland Kitchen  He is coming along very well. He is seen in the. Today's weight is 397.4 BMI of 55.61.since starting her program he is down about 65 pounds.  Plan return mid-October for first lapband fill.

## 2011-03-17 NOTE — Assessment & Plan Note (Signed)
Had lapband APL on 03/01/11.

## 2011-04-26 ENCOUNTER — Other Ambulatory Visit: Payer: Self-pay | Admitting: *Deleted

## 2011-04-26 ENCOUNTER — Encounter: Payer: BC Managed Care – PPO | Attending: Surgery | Admitting: *Deleted

## 2011-04-26 DIAGNOSIS — Z09 Encounter for follow-up examination after completed treatment for conditions other than malignant neoplasm: Secondary | ICD-10-CM | POA: Insufficient documentation

## 2011-04-26 DIAGNOSIS — Z713 Dietary counseling and surveillance: Secondary | ICD-10-CM | POA: Insufficient documentation

## 2011-04-26 DIAGNOSIS — Z9884 Bariatric surgery status: Secondary | ICD-10-CM | POA: Insufficient documentation

## 2011-04-26 MED ORDER — HYDROCHLOROTHIAZIDE 25 MG PO TABS: 25.0000 mg | ORAL_TABLET | Freq: Every day | ORAL | Status: DC

## 2011-04-26 NOTE — Progress Notes (Signed)
  Follow-up visit: 2 Monthss Post-Operative LAGB Surgery  Medical Nutrition Therapy:  Appt start time: 0900 end time:  0930.  Assessment:  Primary concerns today: post-operative bariatric surgery nutrition management.  Weight today: 383.2lb  Weight change: 14.4 lbs Total weight lost: 64.7 lbs total BMI: 53.6% Weight goal: 260 Surgery date: 03/01/11  Start weight at [facility]: 447.9 lbs total  24-hr recall:  B (10 AM): EAS protein shake Snk (AM): N/A  L (12-1 PM): Baked fish (4oz) Snk (3 PM): Atkin's protein bar OR Cheese stick (2)  D (7 PM): Protein Shake (EAS) OR Omelet with cheese/ham Snk (9 PM): Protein bar OR Cheese stick OR SF pudding  Fluid intake: all sugar-free beverages; 64 oz+ Estimated total protein intake: 80g+  Medications: No changes Supplementation: Taking supplements regularly  Using straws: No Drinking while eating: No Hair loss: None reported Carbonated beverages: No N/V/D/C: None reported regularly Last Lap-Band fill: No fill at this time  Recent physical activity:  [employer], 3 times/week, water aerobics/treadmill, 60-90 minutes  Progress Towards Goal(s):  Some progress.  Handouts given during visit include:  Phase 3B - High Protein + Non-starchy Vegetables   Nutritional Diagnosis:  [place]-3.3 Overweight/obesity As related to recent LAGB surgery.  As evidenced by pt following LAGB dietary guidelines for continued weight loss.    Intervention:  Nutrition education.  Monitoring/Evaluation:  Dietary intake, exercise, lap band fills, and body weight. Follow up in 1-2 months for 3-4 month post-op visit.

## 2011-04-26 NOTE — Patient Instructions (Signed)
Goals:  Follow Phase 3B: High Protein + Non-Starchy Vegetables  Eat 3-6 small meals/snacks, every 3-5 hrs  Increase lean protein foods to meet 80-100g goal  Increase fluid intake to 64oz +  Avoid drinking 15 minutes before, during and 30 minutes after eating  Aim for >30 min of physical activity daily  

## 2011-04-29 ENCOUNTER — Ambulatory Visit (INDEPENDENT_AMBULATORY_CARE_PROVIDER_SITE_OTHER): Payer: BC Managed Care – PPO | Admitting: Surgery

## 2011-04-29 DIAGNOSIS — M549 Dorsalgia, unspecified: Secondary | ICD-10-CM

## 2011-04-29 DIAGNOSIS — G8929 Other chronic pain: Secondary | ICD-10-CM

## 2011-04-29 NOTE — Patient Instructions (Signed)

## 2011-04-29 NOTE — Progress Notes (Signed)
Mr. Hitz returns today and he is 1.9 months out from his lap band APL system. There was no hiatal hernia seen on upper GI and no hiatal hernia was repaired at the time of his surgery. He still takes omeprazole for what he described as episodic reflux which was rare.  Today's weight is 378 indicating that he has lost 85 pounds. He is doing well and will follow up with Dr. Dayton Martes regarding his medications.  Today I did his first band fill and added 1 cc to his band. He tolerated water and I will see him back in 6 weeks

## 2011-06-07 ENCOUNTER — Ambulatory Visit: Payer: BC Managed Care – PPO | Admitting: *Deleted

## 2011-06-14 ENCOUNTER — Encounter: Payer: BC Managed Care – PPO | Attending: Surgery | Admitting: *Deleted

## 2011-06-14 DIAGNOSIS — Z9884 Bariatric surgery status: Secondary | ICD-10-CM | POA: Insufficient documentation

## 2011-06-14 DIAGNOSIS — Z09 Encounter for follow-up examination after completed treatment for conditions other than malignant neoplasm: Secondary | ICD-10-CM | POA: Insufficient documentation

## 2011-06-14 DIAGNOSIS — Z713 Dietary counseling and surveillance: Secondary | ICD-10-CM | POA: Insufficient documentation

## 2011-06-14 NOTE — Patient Instructions (Signed)
Goals:  Follow Phase 3B: High Protein + Non-Starchy Vegetables  Or alternatively the PRE-OP DIET  Eat 3-6 small meals/snacks, every 3-5 hrs  Increase lean protein foods to meet 80-100g goal  Increase fluid intake to 64oz +  Avoid drinking 15 minutes before, during and 30 minutes after eating  Aim for >30 min of physical activity daily

## 2011-06-14 NOTE — Progress Notes (Signed)
  Follow-up visit: 12 Weeks Post-Operative LAGB Surgery  Medical Nutrition Therapy:  Appt start time: 0900 end time:  0930.  Assessment:  Primary concerns today: post-operative bariatric surgery nutrition management. Shown has lost ~76 lbs since his weight loss journey began at [facility]. He notes that he is doing well with eating and has made it through the holiday season without overindulging. He notes that he sticks to a "meat and veggies" diet where he consumes mostly proteins and non-starchy vegetables. He states that his band provides good restriction and feels that he is "almost there" but hopes that Dr. Daphine Deutscher will give him another small band fill at his appointment tomorrow.  Weight today: 371.4 lbs Weight change: 11.8 lbs Total weight lost: 76.5 lbs BMI: 51.9% Weight goal: 260 lbs  Surgery date: 03/01/11  Start weight at [facility]: 447.9 lbs total  24-hr recall:  B (10 AM): EAS protein shake  Snk (AM): N/A OR Protein Bar L (12-1 PM): Baked fish (4oz) OR Lean Cuisine Meal (<300 kcals) Snk (3 PM): Atkin's protein bar OR Cheese stick (2)  D (7 PM): Protein Shake (EAS) OR Omelet with cheese/ham (1-2 egg) Snk (9 PM): Protein bar OR Cheese stick OR SF pudding  Fluid intake: Crystal Light, Water = 64 oz + Estimated total protein intake: 80-100g  Medications: No changes at this point Supplementation: Taking MVI and Calcium regularly  Using straws: No Drinking while eating: No Hair loss: No Carbonated beverages: NO N/V/D/C: No Last Lap-Band fill: Pt has had one cc fill at this point. To see Dr. Daphine Deutscher tomorrow for 2nd fill  Recent physical activity:  Pt working out at [employer] on MWF doing water aerobic and walking for 60-90 mins  Progress Towards Goal(s):  In progress.   Nutritional Diagnosis:  [place]-3.3 Overweight/obesity As related to recent LAGB surgery.  As evidenced by pt following LAGB dietary guidelines for continued weight loss.    Intervention:  Nutrition  education.  Monitoring/Evaluation:  Dietary intake, exercise, lap band fills, and body weight. Follow up in 3 months for 6 month post-op visit.

## 2011-06-15 ENCOUNTER — Encounter (INDEPENDENT_AMBULATORY_CARE_PROVIDER_SITE_OTHER): Payer: Self-pay | Admitting: Surgery

## 2011-06-15 ENCOUNTER — Ambulatory Visit (INDEPENDENT_AMBULATORY_CARE_PROVIDER_SITE_OTHER): Payer: BC Managed Care – PPO | Admitting: Surgery

## 2011-06-15 VITALS — BP 140/84 | HR 70 | Temp 97.2°F | Resp 16 | Ht 71.0 in | Wt 370.2 lb

## 2011-06-15 DIAGNOSIS — Z9884 Bariatric surgery status: Secondary | ICD-10-CM

## 2011-06-15 NOTE — Progress Notes (Signed)
Ryaan Vanwagoner Redfield 40 y.o.  Body mass index is 51.63 kg/(m^2).  Patient Active Problem List  Diagnoses  . OBESITY, MORBID  . ESSENTIAL HYPERTENSION, BENIGN  . HYPERTENSION  . OTHER DYSPHAGIA  . LUMBAR STRAIN, ACUTE  . GYNECOMASTIA, UNILATERAL  . HYPERLIPIDEMIA  . Cutaneous skin tags    Allergies  Allergen Reactions  . Lisinopril     REACTION: cough    Past Surgical History  Procedure Date  . Laparoscopic gastric banding 02/2011   Ruthe Mannan, MD, MD No diagnosis found.  Mr. Dansereau who comes in today and his weight is down to 370.2. He has lost about 8 pounds since his last visit or 53 pounds since his surgery in September. He is comfortable with his weight loss and he is feeling restriction and this is leading to decrease consumption. I think he is in agreement and he agrees and we are going to continue to watch him. I will see him back in 6 weeks and consider bandfill at that time. Matt B. Daphine Deutscher, MD, [facility] [phone] beeper [phone]  06/15/2011 9:08 AM

## 2011-06-15 NOTE — Patient Instructions (Signed)
Stay on low carb diet

## 2011-07-26 ENCOUNTER — Other Ambulatory Visit (INDEPENDENT_AMBULATORY_CARE_PROVIDER_SITE_OTHER): Payer: 59

## 2011-07-26 ENCOUNTER — Other Ambulatory Visit: Payer: Self-pay | Admitting: Family Medicine

## 2011-07-26 DIAGNOSIS — E785 Hyperlipidemia, unspecified: Secondary | ICD-10-CM

## 2011-07-26 DIAGNOSIS — I10 Essential (primary) hypertension: Secondary | ICD-10-CM

## 2011-07-26 LAB — BASIC METABOLIC PANEL
BUN: 14 mg/dL (ref 6–23)
CO2: 28 mEq/L (ref 19–32)
GFR: 132.96 mL/min (ref >60.00)
Glucose, Bld: 98 mg/dL (ref 70–99)
Potassium: 4 mEq/L (ref 3.5–5.1)
Sodium: 142 mEq/L (ref 135–145)

## 2011-07-26 LAB — LIPID PANEL
HDL: 37.3 mg/dL — ABNORMAL LOW (ref >39.00)
LDL Cholesterol: 127 mg/dL — ABNORMAL HIGH (ref 0–99)
Total CHOL/HDL Ratio: 5
VLDL: 18.8 mg/dL (ref 0.0–40.0)

## 2011-07-28 ENCOUNTER — Ambulatory Visit (INDEPENDENT_AMBULATORY_CARE_PROVIDER_SITE_OTHER): Payer: 59 | Admitting: Family Medicine

## 2011-07-28 ENCOUNTER — Encounter: Payer: Self-pay | Admitting: Family Medicine

## 2011-07-28 VITALS — BP 120/70 | HR 72 | Temp 97.8°F | Ht 71.0 in | Wt 376.5 lb

## 2011-07-28 DIAGNOSIS — Z0001 Encounter for general adult medical examination with abnormal findings: Secondary | ICD-10-CM | POA: Insufficient documentation

## 2011-07-28 DIAGNOSIS — E785 Hyperlipidemia, unspecified: Secondary | ICD-10-CM

## 2011-07-28 DIAGNOSIS — Z0000 Encounter for general adult medical examination without abnormal findings: Secondary | ICD-10-CM

## 2011-07-28 DIAGNOSIS — I10 Essential (primary) hypertension: Secondary | ICD-10-CM

## 2011-07-28 NOTE — Patient Instructions (Signed)
Let's cut your HCTZ 25 mg in half to 12.5 mg daily. Check you blood pressure and call me in 1 week.   Your labs were fantastic!

## 2011-07-28 NOTE — Progress Notes (Signed)
41 yo here for CPX.  Obesity- s/p lap band in 02/2011.  Doing well.  Lost 100 pounds. Has more energy than he has had in years.   HTN- stable on HCTZ 25 mg daily.    No blurred vision, CP, shortness of breath, DOE.  Dry cough completely resolved, allergy to ACEI now on allergy list.  Tolerating HCTZ with no known side effects. BP Readings from Last 3 Encounters:  07/28/11 120/70  06/15/11 140/84  03/17/11 128/78   Lab Results  Component Value Date   CHOL 183 07/26/2011   CHOL 172 12/15/2010   CHOL 149 09/08/2010   Lab Results  Component Value Date   HDL 37.30* 07/26/2011   HDL 39* 12/15/2010   HDL 33.60* 09/08/2010   Lab Results  Component Value Date   LDLCALC 127* 07/26/2011   LDLCALC 109* 12/15/2010   LDLCALC 87 09/08/2010   Lab Results  Component Value Date   TRIG 94.0 07/26/2011   TRIG 122 12/15/2010   TRIG 142.0 09/08/2010   Lab Results  Component Value Date   CHOLHDL 5 07/26/2011   CHOLHDL 4.4 12/15/2010   CHOLHDL 4 09/08/2010    Lab Results  Component Value Date   ALT 30 02/22/2011   AST 26 02/22/2011   ALKPHOS 81 02/22/2011   BILITOT 0.4 02/22/2011       Patient Active Problem List  Diagnoses  . OBESITY, MORBID  . ESSENTIAL HYPERTENSION, BENIGN  . HYPERTENSION  . OTHER DYSPHAGIA  . LUMBAR STRAIN, ACUTE  . GYNECOMASTIA, UNILATERAL  . HYPERLIPIDEMIA  . Cutaneous skin tags  . APL Lapband 03/01/11  . Routine general medical examination at a health care facility   Past Medical History  Diagnosis Date  . Hyperlipidemia   . Hypertension    Past Surgical History  Procedure Date  . Laparoscopic gastric banding 02/2011   History  Substance Use Topics  . Smoking status: Never Smoker   . Smokeless tobacco: Never Used  . Alcohol Use: Yes   No family history on file. Allergies  Allergen Reactions  . Lisinopril     REACTION: cough   Current Outpatient Prescriptions on File Prior to Visit  Medication Sig Dispense Refill  . calcium citrate-vitamin D 200-200  MG-UNIT TABS Take 4 tablets by mouth daily.        . hydrochlorothiazide (HYDRODIURIL) 25 MG tablet Take 1 tablet (25 mg total) by mouth daily.  30 tablet  11  . Multiple Vitamins-Minerals (MULTIVITAMIN WITH MINERALS) tablet Take 1 tablet by mouth daily.        Marland Kitchen omeprazole (PRILOSEC) 20 MG capsule Take 20 mg by mouth daily.         The PMH, PSH, Social History, Family History, Medications, and allergies have been reviewed in [facility], and have been updated if relevant.   Review of Systems   See HPI Patient reports no  vision/ hearing changes,anorexia, weight change, fever ,adenopathy, persistant / recurrent hoarseness, swallowing issues, chest pain, edema,persistant / recurrent cough, hemoptysis, dyspnea(rest, exertional, paroxysmal nocturnal), gastrointestinal  bleeding (melena, rectal bleeding), abdominal pain, excessive heart burn, GU symptoms(dysuria, hematuria, pyuria, voiding/incontinence  Issues) syncope, focal weakness, severe memory loss, concerning skin lesions, depression, anxiety, abnormal bruising/bleeding, major joint swelling.      Physical Exam BP 120/70  Pulse 72  Temp(Src) 97.8 F (36.6 C) (Oral)  Ht 5\' 11"  (1.803 m)  Wt 376 lb 8 oz (170.779 kg)  BMI 52.51 kg/m2  General:  morbidly obese, alert, very pleasant. Head:  normocephalic, atraumatic, and no abnormalities observed.   Eyes:  vision grossly intact, pupils equal, and pupils round.   Ears:  R ear normal and L ear normal.   Nose:  no external deformity.   Mouth:  good dentition.   Lungs:  Normal respiratory effort, chest expands symmetrically. Lungs are clear to auscultation, no crackles or wheezes. Heart:  Distant HS RRR Abdomen:  obese, soft, NT Msk:  No deformity or scoliosis noted of thoracic or lumbar spine.   Extremities:  no edema Neurologic:  alert & oriented X3 and gait normal.   Skin:  Intact without suspicious lesions or rashes Psych:  Cognition and judgment appear intact. Alert and cooperative with  normal attention span and concentration. No apparent delusions, illusions, hallucinations  Assessment and Plan: 1. Essential hypertension, benign  Improved, now that he lost weight, will cut dose to 12.5 mg daily. See pt instructions for details.  2. Routine general medical examination at a health care facility  Reviewed preventive care protocols, scheduled due services, and updated immunizations Discussed nutrition, exercise, diet, and healthy lifestyle.

## 2011-08-08 ENCOUNTER — Telehealth: Payer: Self-pay | Admitting: *Deleted

## 2011-08-08 NOTE — Telephone Encounter (Signed)
Patient dropped off a note stating that he was told to monitor his BP for a week and report it back to you.  Patient shows that his BP readings have been 125-135/75-85. Patient wants to know if this is sufficient for him to stop taking his BP medication?  Please let patient know,

## 2011-08-08 NOTE — Telephone Encounter (Signed)
Yes that is excellent. Ok to stop taking BP medication, keep checking BP for another week and call us back if elevated above 140/90.

## 2011-08-08 NOTE — Telephone Encounter (Signed)
Per patients request I called [facility] and cancelled automatic refills on the BP medication.

## 2011-08-08 NOTE — Telephone Encounter (Signed)
Patient advised as instructed via telephone, per patients request I called [facility]

## 2011-08-11 ENCOUNTER — Encounter (INDEPENDENT_AMBULATORY_CARE_PROVIDER_SITE_OTHER): Payer: Self-pay | Admitting: Surgery

## 2011-08-11 ENCOUNTER — Ambulatory Visit (INDEPENDENT_AMBULATORY_CARE_PROVIDER_SITE_OTHER): Payer: 59 | Admitting: Surgery

## 2011-08-11 VITALS — BP 128/82 | HR 68 | Temp 97.9°F | Resp 16 | Ht 71.0 in | Wt 378.0 lb

## 2011-08-11 DIAGNOSIS — Z9884 Bariatric surgery status: Secondary | ICD-10-CM

## 2011-08-11 NOTE — Patient Instructions (Signed)

## 2011-08-11 NOTE — Progress Notes (Signed)
Timothy Delacruz Body mass index is 52.72 kg/(m^2).  Having regurgitation:  no  Nocturnal reflux?  no  Amount of fill  1 cc This is only his second fill.  Will see in 3 weeks unless he is feeling restriction in which case I can wait 2 months.  Needs to get back to exercise routine

## 2011-09-01 ENCOUNTER — Encounter (HOSPITAL_COMMUNITY): Payer: Self-pay | Admitting: Emergency Medicine

## 2011-09-01 ENCOUNTER — Emergency Department (INDEPENDENT_AMBULATORY_CARE_PROVIDER_SITE_OTHER)
Admission: EM | Admit: 2011-09-01 | Discharge: 2011-09-01 | Disposition: A | Discharge status: Home Health | Payer: 59 | Source: Home / Self Care | Attending: Family Medicine | Admitting: Family Medicine

## 2011-09-01 ENCOUNTER — Emergency Department (INDEPENDENT_AMBULATORY_CARE_PROVIDER_SITE_OTHER): Payer: 59

## 2011-09-01 DIAGNOSIS — J019 Acute sinusitis, unspecified: Secondary | ICD-10-CM

## 2011-09-01 DIAGNOSIS — J069 Acute upper respiratory infection, unspecified: Secondary | ICD-10-CM

## 2011-09-01 MED ORDER — AMOXICILLIN-POT CLAVULANATE 875-125 MG PO TABS: 1.0000 | ORAL_TABLET | Freq: Two times a day (BID) | ORAL | Status: AC

## 2011-09-01 NOTE — ED Notes (Addendum)
PT HERE WITH C/O FLU LIKE SX THAT STARTED X 1WEEK AGO WITH VERY DIAPHORETIC  OF/ON,CHILLS,BODY ACHES  AND BILAT RIB PAIN.PT ABLE TO KEEP FOODS/FLUIDS DOWN.HAS BEEN TAKING  OTC THERA FLU,ALKA SELTZER AND ASPIRIN BUT NOT WORKING.TEMP ON ADMIT 99.8.PT S/P BARIATRIC LAPBAND LAST SEPT.DENIES SOB OR CP

## 2011-09-01 NOTE — ED Provider Notes (Signed)
History     CSN: [id number]  Arrival date & time 09/01/11  1354   First MD Initiated Contact with Patient 09/01/11 1557      Chief Complaint  Patient presents with  . Influenza    (Consider location/radiation/quality/duration/timing/severity/associated sxs/prior treatment) Patient is a 41 y.o. male presenting with flu symptoms. The history is provided by the patient.  Influenza This is a new problem. The current episode started more than 1 week ago. The problem has been gradually worsening. Pertinent negatives include no shortness of breath.    Past Medical History  Diagnosis Date  . Hyperlipidemia   . Hypertension     Past Surgical History  Procedure Date  . Laparoscopic gastric banding 02/2011    No family history on file.  History  Substance Use Topics  . Smoking status: Never Smoker   . Smokeless tobacco: Never Used  . Alcohol Use: Yes      Review of Systems  Constitutional: Positive for fever and chills.  HENT: Positive for congestion.   Eyes: Negative.   Respiratory: Positive for cough. Negative for shortness of breath and wheezing.   Gastrointestinal: Negative.  Negative for nausea, vomiting and diarrhea.  Musculoskeletal: Positive for myalgias.    Allergies  Lisinopril  Home Medications   Current Outpatient Rx  Name Route Sig Dispense Refill  . AMOXICILLIN-POT CLAVULANATE 875-125 MG PO TABS Oral Take 1 tablet by mouth 2 (two) times daily. 20 tablet 0  . CALCIUM CITRATE-VITAMIN D 200-200 MG-UNIT PO TABS Oral Take 4 tablets by mouth daily.      . MULTI-VITAMIN/MINERALS PO TABS Oral Take 1 tablet by mouth daily.        BP 145/91  Pulse 98  Temp(Src) 99.8 F (37.7 C) (Oral)  Resp 20  SpO2 96%  Physical Exam  Nursing note and vitals reviewed. Constitutional: He is oriented to person, place, and time. He appears well-developed and well-nourished.  HENT:  Head: Normocephalic.  Right Ear: External ear normal.  Left Ear: External ear normal.    Mouth/Throat: Posterior oropharyngeal erythema present.  Neck: Normal range of motion. Neck supple.  Cardiovascular: Normal rate, normal heart sounds and intact distal pulses.   Pulmonary/Chest: Effort normal. He has decreased breath sounds in the right middle field and the right lower field. He has rales in the right middle field and the right lower field.  Abdominal: There is no tenderness.  Lymphadenopathy:    He has no cervical adenopathy.  Neurological: He is alert and oriented to person, place, and time.  Skin: Skin is warm and dry.  Psychiatric: He has a normal mood and affect.    ED Course  Procedures (including critical care time)  Labs Reviewed - No data to display Dg Chest 2 View  09/01/2011  *RADIOLOGY REPORT*  Clinical Data: Fever and cough  CHEST - 2 VIEW  Comparison: 12/08/2010  Findings: The heart size and mediastinal contours are within normal limits.  Both lungs are clear.  The visualized skeletal structures are unremarkable.  IMPRESSION: Negative exam  Original Report Authenticated By: Rosealee Albee, M.D.     1. URI (upper respiratory infection)   2. Sinusitis acute       MDM          Barkley Bruns, MD 09/01/11 1650

## 2011-09-01 NOTE — Discharge Instructions (Signed)
Drink plenty of fluids as discussed, use medicine as prescribed, and mucinex or delsym for cough. Return or see your doctor if further problems °

## 2011-09-09 ENCOUNTER — Encounter (INDEPENDENT_AMBULATORY_CARE_PROVIDER_SITE_OTHER): Payer: 59 | Admitting: Surgery

## 2011-09-13 ENCOUNTER — Encounter: Payer: 59 | Attending: Surgery | Admitting: *Deleted

## 2011-09-13 ENCOUNTER — Encounter: Payer: Self-pay | Admitting: *Deleted

## 2011-09-13 DIAGNOSIS — Z09 Encounter for follow-up examination after completed treatment for conditions other than malignant neoplasm: Secondary | ICD-10-CM | POA: Insufficient documentation

## 2011-09-13 DIAGNOSIS — Z9884 Bariatric surgery status: Secondary | ICD-10-CM | POA: Insufficient documentation

## 2011-09-13 DIAGNOSIS — Z713 Dietary counseling and surveillance: Secondary | ICD-10-CM | POA: Insufficient documentation

## 2011-09-13 NOTE — Patient Instructions (Signed)
Goals:  Follow Phase 3B: High Protein + Non-Starchy Vegetables  Or alternatively the PRE-OP DIET  Eat 3-6 small meals/snacks, every 3-5 hrs  Increase lean protein foods to meet 80-100g goal  Increase fluid intake to 64oz +  Avoid drinking 15 minutes before, during and 30 minutes after eating  Aim for >30 min of physical activity daily 

## 2011-09-13 NOTE — Progress Notes (Signed)
  Follow-up visit: 6 Months Post-Operative LAGB Surgery  Medical Nutrition Therapy:  Appt start time: 0850 end time:  0920.  Assessment:  Primary concerns today: post-operative bariatric surgery nutrition management. Timothy Delacruz has lost ~76 lbs since his weight loss journey began at [facility]. Timothy Delacruz is very frustrated today due to having been very ill with respiratory infections for the past 2 months. He has not been able to exercise like he had been and has not been making the best food choices during his illness. Pt works from home and notes that his energy expenditure is low.  Weight today: 373 lbs Weight change: n/a Total weight lost: 76.5 lbs BMI: 52% Weight goal: 260 lbs  Surgery date: 03/01/11  Start weight at [facility]: 447.9 lbs total  24-hr recall:  (Since "getting back on track") B (10 AM): EAS protein shake  Snk (AM): Atkins Protein Bar L (12-1 PM):EAS protein shake OR Lean Cuisine Meal (<300 kcals) Snk (3 PM): Cheese stick (2)  D (7 PM): Protein Shake (EAS) OR Omelet with cheese/ham (1-2 egg) OR Baked fish (3-4 oz) w/  3 oz baked potato Snk (9 PM): Protein bar OR Cheese stick OR SF pudding  Fluid intake: Crystal Light, Water, Protein shakes, (Occasional chocolate milk) = 64 oz + Estimated total protein intake: 80-100g  Medications: No changes at this point Supplementation: Taking MVI and Calcium regularly  Using straws: No Drinking while eating: No Hair loss: No Carbonated beverages: No N/V/D/C: No Last Lap-Band fill: Pt has had 2 band fills at this point (last fill Feb 2013)  Recent physical activity:  Pt had been working out at [employer] on MWF doing water aerobic and walking for 60-90 mins/ Limited over the past 2 months (0-1 times/per week)  Progress Towards Goal(s):  In progress.   Nutritional Diagnosis:  [place]-3.3 Overweight/obesity As related to recent LAGB surgery.  As evidenced by pt following LAGB dietary guidelines for continued weight loss.    Intervention:   Nutrition education/reinfocement.  Monitoring/Evaluation:  Dietary intake, exercise, lap band fills, and body weight. Follow up in 3-6 months for 9-12 month post-op visit.

## 2011-11-24 ENCOUNTER — Ambulatory Visit (INDEPENDENT_AMBULATORY_CARE_PROVIDER_SITE_OTHER): Payer: 59 | Admitting: Physician Assistant

## 2011-11-24 ENCOUNTER — Encounter (INDEPENDENT_AMBULATORY_CARE_PROVIDER_SITE_OTHER): Payer: Self-pay

## 2011-11-24 VITALS — BP 142/94 | HR 80 | Temp 97.8°F | Resp 16 | Ht 71.0 in | Wt 380.4 lb

## 2011-11-24 DIAGNOSIS — Z4651 Encounter for fitting and adjustment of gastric lap band: Secondary | ICD-10-CM

## 2011-11-24 NOTE — Patient Instructions (Signed)
Take clear liquids tonight. Thin protein shakes are ok to start tomorrow morning. Slowly advance your diet thereafter. Call us if you have persistent vomiting or regurgitation, night cough or reflux symptoms. Return as scheduled or sooner if you notice no changes in hunger/portion sizes.  

## 2011-11-24 NOTE — Progress Notes (Signed)
  HISTORY: Timothy Delacruz is a 41 y.o.male who received an AP-Large lap-band in September 2012 by Dr. Daphine Deutscher. He comes in with some mild hunger and not a huge increase in his portion sizes but his weight loss has stagnated. No regurgitation symtpoms.  VITAL SIGNS: Filed Vitals:   11/24/11 1207  BP: 142/94  Pulse: 80  Temp: 97.8 F (36.6 C)  Resp: 16    PHYSICAL EXAM: Physical exam reveals a very well-appearing 41 y.o.male in no apparent distress Neurologic: Awake, alert, oriented Psych: Bright affect, conversant Respiratory: Breathing even and unlabored. No stridor or wheezing Abdomen: Soft, nontender, nondistended to palpation. Incisions well-healed. No incisional hernias. Port easily palpated. Extremities: Atraumatic, good range of motion.  ASSESMENT: 41 y.o.  male  s/p AP-Large lap-band.   PLAN: The patient's port was accessed with a 20G Huber needle without difficulty. Clear fluid was aspirated and 1 mL saline was added to the port . The patient was able to swallow water without difficulty following the procedure and was instructed to take clear liquids for the next 24-48 hours and advance slowly as tolerated.

## 2012-02-23 ENCOUNTER — Encounter (INDEPENDENT_AMBULATORY_CARE_PROVIDER_SITE_OTHER): Payer: 59

## 2012-03-01 ENCOUNTER — Ambulatory Visit: Payer: 59 | Admitting: *Deleted

## 2012-03-05 ENCOUNTER — Encounter: Payer: 59 | Attending: Family Medicine | Admitting: *Deleted

## 2012-03-05 ENCOUNTER — Encounter: Payer: Self-pay | Admitting: *Deleted

## 2012-03-05 DIAGNOSIS — Z713 Dietary counseling and surveillance: Secondary | ICD-10-CM | POA: Insufficient documentation

## 2012-03-05 DIAGNOSIS — Z09 Encounter for follow-up examination after completed treatment for conditions other than malignant neoplasm: Secondary | ICD-10-CM | POA: Insufficient documentation

## 2012-03-05 DIAGNOSIS — Z9884 Bariatric surgery status: Secondary | ICD-10-CM | POA: Insufficient documentation

## 2012-03-05 NOTE — Progress Notes (Signed)
  Follow-up visit: 12 Months Post-Operative LAGB Surgery  Medical Nutrition Therapy:  Appt start time: 0850 end time:  0920.  Primary concerns today: post-operative bariatric surgery nutrition management.  Amro returns today for 12 mo f/u. Reports he has been under extreme stress at work, though has tried to make better food choices. He has not been able to exercise like he had been and plans to resume his old exercise regimen once he starts new work schedule of 7pm-3:30am in a few weeks; still works from home. States he know what he needs to do and has already started to get himself back on track. Still feels as though he is in the green zone.  Surgery date: 03/01/11  Start weight at [facility]: 447.9 lbs total  Weight today: 387.3 lbs (with heavy lead toe boots) Weight change: + 14.3 lbs Total weight lost: 62.2 lbs BMI: 54.0 kg/m^2 Weight goal: 260 lbs  24-hr recall:   B (10 AM): EAS protein shake  Snk (AM): Atkins Protein Bar L (12-1 PM):EAS protein shake OR Lean Cuisine Meal (<300 kcals) Snk (3 PM): Cheese stick (2) OR none D (7 PM): Protein Shake (EAS) OR Baked fish (3-4 oz)  Snk (9 PM): Protein bar OR Cheese stick  *Once a week may get a burger from [employer] (w/ no bun)  Fluid intake: Crystal Light, Water, Protein shakes, (Occasional chocolate milk) = 64 oz + Estimated total protein intake: 70-80g  Medications: No changes reported Supplementation: Taking MVI and Calcium regularly  Using straws: No Drinking while eating: No Hair loss: No Carbonated beverages: No N/V/D/C: Had issues with potatoes, french fries, and dry chicken getting stuck; has not tried since Last Lap-Band fill: Last fill 11/24/11 (Pt's 3rd total)  Recent physical activity: Continues to be limited. Plans to resume when new work schedule begins on 9/20.  Progress Towards Goal(s):  In progress.   Nutritional Diagnosis:  [place]-3.3 Overweight/obesity As related to recent LAGB surgery.  As evidenced by pt following  LAGB dietary guidelines for continued weight loss.    Intervention:  Nutrition education/reinfocement.  Monitoring/Evaluation:  Dietary intake, exercise, lap band fills, and body weight. Follow up in 6 months for 18 month post-op visit.

## 2012-03-05 NOTE — Patient Instructions (Addendum)
Goals:  Follow Phase 3B: High Protein + Non-Starchy Vegetables  Or alternatively the PRE-OP DIET  Eat 3-6 small meals/snacks, every 3-5 hrs  Increase lean protein foods to meet 80-100g goal  Increase fluid intake to 64oz +  Avoid drinking 15 minutes before, during and 30 minutes after eating  Aim for >30 min of physical activity daily 

## 2012-03-15 ENCOUNTER — Ambulatory Visit: Payer: 59 | Admitting: *Deleted

## 2012-09-03 ENCOUNTER — Encounter: Payer: Self-pay | Admitting: *Deleted

## 2012-09-03 ENCOUNTER — Encounter: Payer: 59 | Attending: Surgery | Admitting: *Deleted

## 2012-09-03 DIAGNOSIS — Z713 Dietary counseling and surveillance: Secondary | ICD-10-CM | POA: Insufficient documentation

## 2012-09-03 NOTE — Progress Notes (Addendum)
  Follow-up visit: 18 Months Post-Operative LAGB Surgery  Medical Nutrition Therapy:  Appt start time: 11:15 end time: 11:45.  Primary concerns today: post-operative bariatric surgery nutrition management.  Timothy Delacruz returns today for 18 mo f/u.  Reports continuation of extreme stress d/t having 3 family members dx with cancer right before Christmas. States he participated in excessive emotional eating (oreos, sweets, fast food). Has already discontinued many of the adverse behaviors and is getting back on track.  No exercise, though states intent to return to [facility]. Also discussed BELT program.  Surgery date: 03/01/11  Start weight at [facility]: 447.9 lbs   Weight today: 401.3 lbs  Weight change: 14.0 lbs  GAIN Total weight lost: 48.2 lbs BMI: 56.0 kg/m^2 Weight goal: 260 lbs  24-hr recall:  (new eating pattern) B (11-12 AM): EAS protein shake  Snk (AM): NONE L (3-4 PM): Filet-o-Fish or chicken sandwich w/ bun Snk ( PM):  Oreos OR pepperoni D (11 PM): Protein Shake (EAS) OR bowl of cereal w/ milk   Snk (9 PM):  NONE b/c sleeping  Fluid intake: Crystal Light, Water, Protein shakes, (Occasional chocolate milk) = 64 oz + Estimated total protein intake: 70-80g  Medications: No changes reported Supplementation: Taking MVI and Calcium regularly  Using straws: No Drinking while eating: No Hair loss: No Carbonated beverages: No N/V/D/C: No Last Lap-Band fill: Last fill 11/24/11 (Pt's 3rd total) - feels mostly in the green zone but states "I'm just eating the wrong things"  Recent physical activity: Continues to be limited d/t stress regarding family/cancer dx  Progress Towards Goal(s):  In progress.   Nutritional Diagnosis:  [place]-3.3 Overweight/obesity As related to recent LAGB surgery.  As evidenced by pt following LAGB dietary guidelines for continued weight loss.    Intervention:  Nutrition education/reinfocement.  Samples given during visit include:   Premier Protein Shake: 1 ea Lot:  [id number]; Exp: 05/04/13  Monitoring/Evaluation:  Dietary intake, exercise, lap band fills, and body weight. Follow up in 6 months for 18 month post-op visit.

## 2012-09-03 NOTE — Patient Instructions (Addendum)
Goals:  Follow Phase 3B: High Protein + Non-Starchy Vegetables  Or alternatively the PRE-OP DIET  Eat 3-6 small meals/snacks, every 3-5 hrs  Increase lean protein foods to meet 80-100g goal  Increase fluid intake to 64oz +  Avoid drinking 15 minutes before, during and 30 minutes after eating  Aim for >30 min of physical activity daily - Resume exercise at the [facility] and/or contact BELT program.

## 2012-10-09 ENCOUNTER — Telehealth (INDEPENDENT_AMBULATORY_CARE_PROVIDER_SITE_OTHER): Payer: Self-pay | Admitting: Surgery

## 2012-10-09 NOTE — Telephone Encounter (Signed)
10/09/12 lm and mailed recall letter for patient to schedule a bariatric follow-up appt. Dr. Daphine Deutscher did lap band surgery 03/01/11. (lss)

## 2013-01-14 ENCOUNTER — Ambulatory Visit (INDEPENDENT_AMBULATORY_CARE_PROVIDER_SITE_OTHER): Payer: 59 | Admitting: Family Medicine

## 2013-01-14 ENCOUNTER — Encounter: Payer: Self-pay | Admitting: Family Medicine

## 2013-01-14 VITALS — BP 130/70 | HR 84 | Temp 97.9°F | Ht 71.0 in | Wt >= 6400 oz

## 2013-01-14 DIAGNOSIS — L720 Epidermal cyst: Secondary | ICD-10-CM | POA: Insufficient documentation

## 2013-01-14 DIAGNOSIS — L723 Sebaceous cyst: Secondary | ICD-10-CM

## 2013-01-14 DIAGNOSIS — L089 Local infection of the skin and subcutaneous tissue, unspecified: Secondary | ICD-10-CM

## 2013-01-14 HISTORY — DX: Epidermal cyst: L72.0

## 2013-01-14 MED ORDER — SULFAMETHOXAZOLE-TMP DS 800-160 MG PO TABS: 1.0000 | ORAL_TABLET | Freq: Two times a day (BID) | ORAL | Status: DC

## 2013-01-14 NOTE — Patient Instructions (Addendum)
Good to see you. Please take Bactrim as directed- 1 tablet twice daily for 10 days.  Please call me towards the end of the week with an update.

## 2013-01-14 NOTE — Progress Notes (Signed)
  Subjective:    Patient ID: Timothy Delacruz, male    DOB: [birth year], 42 y.o.   MRN: [id number]  HPI Very pleasant 42 yo male here for cyst on back that is now painful and hard x 1 week.  Has been there for years and never caused any pain or became firm until last week.  No fevers, chills, nausea or vomiting.  No drainage from cyst.  No rashes.  Has had another cyst that had to be removed in past.  Patient Active Problem List   Diagnosis Date Noted  . Infected epidermoid cyst 01/14/2013  . Routine general medical examination at a health care facility 07/28/2011  . APL Lapband 03/01/11 06/15/2011  . Cutaneous skin tags 03/14/2011  . HYPERLIPIDEMIA 09/08/2010  . GYNECOMASTIA, UNILATERAL 08/04/2010  . LUMBAR STRAIN, ACUTE 02/15/2010  . OBESITY, MORBID 06/09/2009  . ESSENTIAL HYPERTENSION, BENIGN 06/09/2009  . HYPERTENSION 06/09/2009  . OTHER DYSPHAGIA 06/09/2009   Past Medical History  Diagnosis Date  . Hyperlipidemia   . Hypertension    Past Surgical History  Procedure Laterality Date  . Laparoscopic gastric banding  02/2011   History  Substance Use Topics  . Smoking status: Never Smoker   . Smokeless tobacco: Never Used  . Alcohol Use: Yes   Family History  Problem Relation Age of Onset  . Cancer Paternal Aunt     lung  . Cancer Father     prostate  . Heart attack Paternal Grandmother   . Cancer Paternal Grandfather     Cancer   Allergies  Allergen Reactions  . Lisinopril     REACTION: cough   Current Outpatient Prescriptions on File Prior to Visit  Medication Sig Dispense Refill  . calcium citrate-vitamin D 200-200 MG-UNIT TABS Take 4 tablets by mouth daily.        . Multiple Vitamins-Minerals (MULTIVITAMIN WITH MINERALS) tablet Take 1 tablet by mouth daily.         No current facility-administered medications on file prior to visit.   The PMH, PSH, Social History, Family History, Medications, and allergies have been reviewed in [facility], and have been updated  if relevant.   Review of Systems See HPI    Objective:   Physical Exam  Constitutional: He appears well-developed and well-nourished. No distress.  HENT:  Head: Normocephalic.  Skin: Skin is warm and dry.     Psychiatric: He has a normal mood and affect. His speech is normal and behavior is normal. Judgment and thought content normal. Cognition and memory are normal.   BP 130/70  Pulse 84  Temp(Src) 97.9 F (36.6 C)  Ht 5\' 11"  (1.803 m)  Wt 402 lb (182.346 kg)  BMI 56.09 kg/m2  SpO2 96%        Assessment & Plan:  1. Infected epidermoid cyst Treat with po bactrim ds - 1 tablet twice daily x 10 days. After tx, advised removal of cyst. Pt will call towards end of the week with an update, sooner if symptoms worsen.

## 2013-03-06 ENCOUNTER — Ambulatory Visit: Payer: 59 | Admitting: *Deleted

## 2013-03-13 ENCOUNTER — Encounter: Payer: 59 | Attending: Surgery | Admitting: Dietician

## 2013-03-13 DIAGNOSIS — Z713 Dietary counseling and surveillance: Secondary | ICD-10-CM | POA: Insufficient documentation

## 2013-03-13 NOTE — Progress Notes (Signed)
  Follow-up visit: 18 Months Post-Operative LAGB Surgery  Medical Nutrition Therapy:  Appt start time: 11:15 end time: 11:45.  Primary concerns today: post-operative bariatric surgery nutrition management. Danarius returns today for 24 mo f/u. Reports still having a lot of stress in his life since he still has family members fighting cancer, lost his partner in May, and has work stress. Trying to manage emotional eating and not having tempting foods around the house.  Feels tired all the time and works at night so having trouble motivating to exercising. Going for walks about 2 x week for about 20 minutes.   Eating when he is stressed or bored.   Surgery date: 03/01/11  Start weight at [facility]: 447.9 lbs   Weight today: 397.3 lbs  Weight change: 4 lbs  Loss Total weight lost: 50.6 lbs BMI: 56.0 kg/m^2 Weight goal: 260 lbs  24-hr recall:  (new eating pattern) B (12 PM): EAS protein shake  Snk (AM): NONE L (4 PM): Healthy Choice dinner or protein such as hot dogs or fish or cheeseburger without bun Snk ( PM):  Protein bar D (9 PM): Protein Shake (EAS)  Snk (11 PM):  Soup or hot dogs with mustard  Fluid intake: Crystal Light, Water, Protein shakes, diet sodas = 64 oz + Estimated total protein intake: 70-80g  Medications: No changes reported Supplementation: Taking MVI and Calcium regularly  Using straws: on occasion  Drinking while eating: will drink after right after meals Hair loss: No Carbonated beverages: 1 small can x day  N/V/D/C: No Last Lap-Band fill: Last fill 11/24/11 (Pt's 3rd total) - feels mostly in the green zone but states "I'm just eating the wrong things"  Recent physical activity: Continues to be limited d/t stress regarding family/cancer dx  Progress Towards Goal(s):  In progress.   Nutritional Diagnosis:  [place]-3.3 Overweight/obesity As related to recent LAGB surgery.  As evidenced by pt following LAGB dietary guidelines for continued weight loss.    Intervention:   Nutrition education/reinfocement. Recommended seeking out counseling for stress management.   Monitoring/Evaluation:  Dietary intake, exercise, lap band fills, and body weight. Follow up in 6 months for 30 month post-op visit.

## 2013-03-13 NOTE — Patient Instructions (Addendum)
Goals:  Follow Phase 3B: High Protein + Non-Starchy Vegetables  Eat 3-6 small meals/snacks, every 3-5 hrs  Increase lean protein foods to meet 80-100g goal  Increase fluid intake to 64oz +  Avoid drinking 15 minutes before, during and 30 minutes after eating  Aim for >30 min of physical activity daily - Resume exercise at the [facility] and/or contact BELT program. Look for yogurt with less than 12 g carbohydrates Starting preparing foods at home on weekends Consider meeting with a counselor for stress management

## 2013-03-20 ENCOUNTER — Ambulatory Visit (INDEPENDENT_AMBULATORY_CARE_PROVIDER_SITE_OTHER): Payer: 59 | Admitting: Family Medicine

## 2013-03-20 ENCOUNTER — Encounter: Payer: Self-pay | Admitting: Family Medicine

## 2013-03-20 VITALS — BP 134/72 | HR 109 | Temp 98.0°F | Ht 70.25 in | Wt 393.2 lb

## 2013-03-20 DIAGNOSIS — Z634 Disappearance and death of family member: Secondary | ICD-10-CM | POA: Insufficient documentation

## 2013-03-20 DIAGNOSIS — D239 Other benign neoplasm of skin, unspecified: Secondary | ICD-10-CM

## 2013-03-20 DIAGNOSIS — E785 Hyperlipidemia, unspecified: Secondary | ICD-10-CM

## 2013-03-20 DIAGNOSIS — Z808 Family history of malignant neoplasm of other organs or systems: Secondary | ICD-10-CM

## 2013-03-20 DIAGNOSIS — N529 Male erectile dysfunction, unspecified: Secondary | ICD-10-CM | POA: Insufficient documentation

## 2013-03-20 DIAGNOSIS — D229 Melanocytic nevi, unspecified: Secondary | ICD-10-CM

## 2013-03-20 DIAGNOSIS — Z125 Encounter for screening for malignant neoplasm of prostate: Secondary | ICD-10-CM

## 2013-03-20 DIAGNOSIS — Z0000 Encounter for general adult medical examination without abnormal findings: Secondary | ICD-10-CM

## 2013-03-20 DIAGNOSIS — I10 Essential (primary) hypertension: Secondary | ICD-10-CM

## 2013-03-20 HISTORY — DX: Disappearance and death of family member: Z63.4

## 2013-03-20 LAB — COMPREHENSIVE METABOLIC PANEL
Alkaline Phosphatase: 55 U/L (ref 39–117)
BUN: 12 mg/dL (ref 6–23)
Creatinine, Ser: 0.8 mg/dL (ref 0.4–1.5)
Glucose, Bld: 88 mg/dL (ref 70–99)
Total Bilirubin: 0.8 mg/dL (ref 0.3–1.2)

## 2013-03-20 LAB — PSA: PSA: 1.05 ng/mL (ref 0.10–4.00)

## 2013-03-20 LAB — LIPID PANEL
Cholesterol: 204 mg/dL — ABNORMAL HIGH (ref 0–200)
HDL: 38.2 mg/dL — ABNORMAL LOW (ref >39.00)
Total CHOL/HDL Ratio: 5
Triglycerides: 138 mg/dL (ref 0.0–149.0)
VLDL: 27.6 mg/dL (ref 0.0–40.0)

## 2013-03-20 NOTE — Progress Notes (Signed)
42 yo pleasant male here for CPX.  Obesity- s/p lap band in 02/2011.  Doing well.  Has regained some of his weight due to life stressors. Father being treated for melanoma and his partner of 20 years passed away in September 08, 2012.  He feels he is coping with this ok.  Only issue is some difficulty reaching orgasm.  He has no difficulty with when he is masturbating.  Also has no difficulty getting an erection.  Does have h/o gynecomastia.   Wt Readings from Last 3 Encounters:  03/20/13 393 lb 4 oz (178.377 kg)  03/13/13 397 lb 4.8 oz (180.214 kg)  01/14/13 402 lb (182.346 kg)      HTN- stable on HCTZ 25 mg daily.    No blurred vision, CP, shortness of breath, DOE.  Dry cough completely resolved, allergy to ACEI now on allergy list.  Tolerating HCTZ with no known side effects. BP Readings from Last 3 Encounters:  01/14/13 130/70  11/24/11 142/94  09/01/11 145/91   Lab Results  Component Value Date   CHOL 183 07/26/2011   HDL 37.30* 07/26/2011   LDLCALC 127* 07/26/2011   LDLDIRECT 151.1 08/04/2010   TRIG 94.0 07/26/2011   CHOLHDL 5 07/26/2011     Lab Results  Component Value Date   ALT 30 02/22/2011   AST 26 02/22/2011   ALKPHOS 81 02/22/2011   BILITOT 0.4 02/22/2011       Patient Active Problem List   Diagnosis Date Noted  . Infected epidermoid cyst 01/14/2013  . Routine general medical examination at a health care facility 07/28/2011  . APL Lapband 03/01/11 06/15/2011  . Cutaneous skin tags 03/14/2011  . HYPERLIPIDEMIA 09/08/2010  . GYNECOMASTIA, UNILATERAL 08/04/2010  . LUMBAR STRAIN, ACUTE 02/15/2010  . OBESITY, MORBID 06/09/2009  . ESSENTIAL HYPERTENSION, BENIGN 06/09/2009  . HYPERTENSION 06/09/2009  . OTHER DYSPHAGIA 06/09/2009   Past Medical History  Diagnosis Date  . Hyperlipidemia   . Hypertension    Past Surgical History  Procedure Laterality Date  . Laparoscopic gastric banding  02/2011   History  Substance Use Topics  . Smoking status: Never Smoker   . Smokeless  tobacco: Never Used  . Alcohol Use: Yes   Family History  Problem Relation Age of Onset  . Cancer Paternal Aunt     lung  . Cancer Father     prostate  . Heart attack Paternal Grandmother   . Cancer Paternal Grandfather     Cancer   Allergies  Allergen Reactions  . Lisinopril     REACTION: cough   Current Outpatient Prescriptions on File Prior to Visit  Medication Sig Dispense Refill  . calcium citrate-vitamin D 200-200 MG-UNIT TABS Take 4 tablets by mouth daily.        . Multiple Vitamins-Minerals (MULTIVITAMIN WITH MINERALS) tablet Take 1 tablet by mouth daily.        Marland Kitchen sulfamethoxazole-trimethoprim (BACTRIM DS) 800-160 MG per tablet Take 1 tablet by mouth 2 (two) times daily.  20 tablet  0   No current facility-administered medications on file prior to visit.   The PMH, PSH, Social History, Family History, Medications, and allergies have been reviewed in [facility], and have been updated if relevant.   Review of Systems   See HPI Patient reports no  vision/ hearing changes,anorexia, weight change, fever ,adenopathy, persistant / recurrent hoarseness, swallowing issues, chest pain, edema,persistant / recurrent cough, hemoptysis, dyspnea(rest, exertional, paroxysmal nocturnal), gastrointestinal  bleeding (melena, rectal bleeding), abdominal pain, excessive heart burn, GU symptoms(dysuria,  hematuria, pyuria, voiding/incontinence  Issues) syncope, focal weakness, severe memory loss, concerning skin lesions, depression, anxiety, abnormal bruising/bleeding, major joint swelling.      Physical Exam BP 134/72  Pulse 109  Temp(Src) 98 F (36.7 C) (Oral)  Ht 5' 10.25" (1.784 m)  Wt 393 lb 4 oz (178.377 kg)  BMI 56.05 kg/m2  SpO2 97%  General:  morbidly obese, alert, very pleasant. Head:  normocephalic, atraumatic, and no abnormalities observed.   Eyes:  vision grossly intact, pupils equal, and pupils round.   Ears:  R ear normal and L ear normal.   Nose:  no external deformity.    Mouth:  good dentition.   Lungs:  Normal respiratory effort, chest expands symmetrically. Lungs are clear to auscultation, no crackles or wheezes. Heart:  Distant HS RRR Abdomen:  obese, soft, NT Msk:  No deformity or scoliosis noted of thoracic or lumbar spine.   Extremities:  no edema Neurologic:  alert & oriented X3 and gait normal.   Skin:  One irregular nevus on top of scalp Psych:  Cognition and judgment appear intact. Alert and cooperative with normal attention span and concentration. No apparent delusions, illusions, hallucinations  Assessment and Plan:  1. Routine general medical examination at a health care facility Reviewed preventive care protocols, scheduled due services, and updated immunizations Discussed nutrition, exercise, diet, and healthy lifestyle.  - Comprehensive metabolic panel - Testosterone  2. OBESITY, MORBID Has improved over last several months. Getting back on his diet.  3. HYPERTENSION Stable. No changes.  4. HYPERLIPIDEMIA  - Lipid Panel  5. Screening for prostate cancer  - PSA  6. Family history of melanoma  - Ambulatory referral to Dermatology  7. Multiple nevi  - Ambulatory referral to Dermatology  8. Erectile dysfunction Probable psychological given recent stressors, including loss of his partner of 20 years. Will check TSH and PSA. The patient indicates understanding of these issues and agrees with the plan.   9. Loss or death of partner Seems to be coping ok. He will continue to update me.

## 2013-03-20 NOTE — Patient Instructions (Addendum)
Good to see you. We will call you with your lab results.  We will call you with your dermatology referral.

## 2013-03-22 ENCOUNTER — Other Ambulatory Visit: Payer: Self-pay | Admitting: Family Medicine

## 2013-03-22 ENCOUNTER — Encounter: Payer: Self-pay | Admitting: *Deleted

## 2013-03-22 ENCOUNTER — Encounter: Payer: Self-pay | Admitting: Family Medicine

## 2013-03-22 DIAGNOSIS — R7989 Other specified abnormal findings of blood chemistry: Secondary | ICD-10-CM | POA: Insufficient documentation

## 2013-03-22 HISTORY — DX: Other specified abnormal findings of blood chemistry: R79.89

## 2013-05-02 ENCOUNTER — Other Ambulatory Visit: Payer: Self-pay

## 2013-09-10 ENCOUNTER — Ambulatory Visit: Payer: 59 | Admitting: Dietician

## 2013-10-10 ENCOUNTER — Telehealth (HOSPITAL_COMMUNITY): Payer: Self-pay

## 2013-10-10 NOTE — Telephone Encounter (Signed)
This patient is overdue for recommended follow-up with a bariatric surgeon at [facility]. Call attempted today to reestablish post-op care with [facility], but unable to reach patient by phone.  A letter will be mailed to the patient today to the address on file from [place] & [facility] advising the patient on the benefits of follow-up care and directing them to call [facility] at [phone] to schedule an appointment at their earliest convenience.  ° °Amanda T. Fleming °Bariatric Office Coordinator °[phone] ° °

## 2014-08-15 ENCOUNTER — Encounter (HOSPITAL_COMMUNITY): Payer: Self-pay

## 2014-08-15 ENCOUNTER — Emergency Department (INDEPENDENT_AMBULATORY_CARE_PROVIDER_SITE_OTHER)
Admission: EM | Admit: 2014-08-15 | Discharge: 2014-08-15 | Disposition: A | Discharge status: Home / Self Care | Payer: 59 | Source: Home / Self Care | Attending: Emergency Medicine | Admitting: Emergency Medicine

## 2014-08-15 DIAGNOSIS — R42 Dizziness and giddiness: Secondary | ICD-10-CM

## 2014-08-15 LAB — POCT URINALYSIS DIP (DEVICE)
BILIRUBIN URINE: NEGATIVE
GLUCOSE, UA: NEGATIVE mg/dL
Hgb urine dipstick: NEGATIVE
KETONES UR: NEGATIVE mg/dL
LEUKOCYTES UA: NEGATIVE
Nitrite: NEGATIVE
PROTEIN: NEGATIVE mg/dL
Specific Gravity, Urine: 1.02 (ref 1.005–1.030)
UROBILINOGEN UA: 0.2 mg/dL (ref 0.0–1.0)
pH: 7 (ref 5.0–8.0)

## 2014-08-15 LAB — POCT I-STAT, CHEM 8
BUN: 16 mg/dL (ref 6–23)
CALCIUM ION: 1.08 mmol/L — AB (ref 1.12–1.23)
CHLORIDE: 104 mmol/L (ref 96–112)
CREATININE: 0.7 mg/dL (ref 0.50–1.35)
Glucose, Bld: 96 mg/dL (ref 70–99)
HEMATOCRIT: 50 % (ref 39.0–52.0)
Hemoglobin: 17 g/dL (ref 13.0–17.0)
Potassium: 4.1 mmol/L (ref 3.5–5.1)
Sodium: 140 mmol/L (ref 135–145)
TCO2: 23 mmol/L (ref 0–100)

## 2014-08-15 NOTE — ED Provider Notes (Signed)
CSN: [id number]     Arrival date & time 08/15/14  1900 History   First MD Initiated Contact with Patient 08/15/14 1939     Chief Complaint  Patient presents with  . Dizziness   (Consider location/radiation/quality/duration/timing/severity/associated sxs/prior Treatment) HPI Comments: Patient reports he was finishing up some work at home yesterday evening when he became suddenly dizzy and felt overheated. Dizziness described as a sensation of his head spinning. Went to his bedroom and tried to lie down and this made symptoms worse and he states the whole room began spinning and he began to feel very nauseated. Did not experience headache, chest pain, near syncope, palpitations, changes in vision, speech or develop a focal loss of strength or sensation. Went to bathroom because he thought he might vomit, splashed some cold water on his face, sat upright for awhile and symptoms began to improve. He finally became comfortable enough that he states he was able to lie down and go to bed for the night. When he woke this morning, he noticed some mild nausea and vague sense of "not feeling right" but was able to perform all of his usual activities throughout the day today without issue.  Does mention that he has been under some increased stress over the past two weeks as he recently learned that his father has terminal cancer. Has been traveling to [place] frequently to visit family.   Patient is a 44 y.o. male presenting with dizziness. The history is provided by the patient.  Dizziness Quality:  Head spinning and room spinning Severity:  Moderate Onset quality:  Sudden Duration:  1 day Progression since onset: +nearly resolved. Chronicity:  New Associated symptoms: nausea   Associated symptoms: no blood in stool, no diarrhea, no headaches, no vomiting and no weakness     Past Medical History  Diagnosis Date  . Hyperlipidemia   . Hypertension    Past Surgical History  Procedure Laterality Date   . Laparoscopic gastric banding  02/2011   Family History  Problem Relation Age of Onset  . Cancer Paternal Aunt     lung  . Cancer Father     prostate  . Heart attack Paternal Grandmother   . Cancer Paternal Grandfather     Cancer   History  Substance Use Topics  . Smoking status: Never Smoker   . Smokeless tobacco: Never Used  . Alcohol Use: Yes     Comment: occ    Review of Systems  Constitutional: Negative.   HENT: Negative.   Eyes: Negative.   Respiratory: Negative.   Cardiovascular: Negative.   Gastrointestinal: Positive for nausea. Negative for vomiting, abdominal pain, diarrhea, constipation, blood in stool and anal bleeding.  Endocrine: Negative for polydipsia, polyphagia and polyuria.  Genitourinary: Negative.   Musculoskeletal: Negative.   Skin: Negative.   Neurological: Positive for dizziness. Negative for tremors, seizures, syncope, facial asymmetry, speech difficulty, weakness, light-headedness, numbness and headaches.    Allergies  Lisinopril  Home Medications   Prior to Admission medications   Medication Sig Start Date End Date Taking? Authorizing Provider  calcium citrate-vitamin D 200-200 MG-UNIT TABS Take 4 tablets by mouth daily.      Historical Provider, MD  Multiple Vitamins-Minerals (MULTIVITAMIN WITH MINERALS) tablet Take 1 tablet by mouth daily.      Historical Provider, MD   BP 158/94 mmHg  Pulse 93  Temp(Src) 97.8 F (36.6 C) (Oral)  Resp 20  SpO2 95% Physical Exam  Constitutional: He is oriented to person, place, and  time. Vital signs are normal. He appears well-developed and well-nourished. He is cooperative.  Non-toxic appearance. He does not have a sickly appearance. He does not appear ill. No distress.  HENT:  Head: Normocephalic and atraumatic.  Right Ear: Hearing, tympanic membrane, external ear and ear canal normal. No middle ear effusion.  Left Ear: Hearing, tympanic membrane, external ear and ear canal normal.  No middle ear  effusion.  Nose: Nose normal.  Mouth/Throat: Uvula is midline, oropharynx is clear and moist and mucous membranes are normal.  Eyes: Conjunctivae and EOM are normal. Pupils are equal, round, and reactive to light.  Neck: Trachea normal, normal range of motion, full passive range of motion without pain and phonation normal. Neck supple. No JVD present. Carotid bruit is not present. No thyroid mass present.  Cardiovascular: Normal rate, regular rhythm and normal heart sounds.   Pulmonary/Chest: Effort normal and breath sounds normal. No respiratory distress. He has no wheezes.  Abdominal: Soft. Bowel sounds are normal. He exhibits no distension. There is no tenderness.  Musculoskeletal: Normal range of motion.  Lymphadenopathy:    He has no cervical adenopathy.  Neurological: He is alert and oriented to person, place, and time. He has normal strength. No cranial nerve deficit or sensory deficit. Coordination and gait normal. GCS eye subscore is 4. GCS verbal subscore is 5. GCS motor subscore is 6.  Skin: Skin is warm and dry.  Psychiatric: He has a normal mood and affect. His behavior is normal.  Nursing note and vitals reviewed.   ED Course  Procedures (including critical care time) Labs Review Labs Reviewed  POCT I-STAT, CHEM 8 - Abnormal; Notable for the following:    Calcium, Ion 1.08 (*)    All other components within normal limits  POCT URINALYSIS DIP (DEVICE)    Imaging Review No results found.   MDM   1. Dizziness    Case discussed with and patient examined by Dr. Philipp Deputy.  UA & Istat 8 normal.  ECG: NSR at 84 bpm. No ectopy. Normal intervals. No previous tracings available for comparison.  Exam without reproducible symptom or focal, persistent neurological deficit. I did advise patient that he should follow up with [facility] an ambulatory referral place in [place].  Cautioned patient that should symptoms return, become suddenly worse or severe or become  associated with chest pain, headache, weakness, changes in speech, vision, balance, he should dial 911 and have himself re-evaluated at his nearest emergency room.     Lutricia Feil, [place] 08/15/14 [id number]

## 2014-08-15 NOTE — Discharge Instructions (Signed)
As we discussed, while your evaluation here was normal, should your symptoms reoccur, become suddenly worse or severe or become associated with headache, please dial 911 and have yourself re-evaluated at your nearest emergency room immediately.  Dizziness Dizziness is a common problem. It is a feeling of unsteadiness or light-headedness. You may feel like you are about to faint. Dizziness can lead to injury if you stumble or fall. A person of any age group can suffer from dizziness, but dizziness is more common in older adults. CAUSES  Dizziness can be caused by many different things, including:  Middle ear problems.  Standing for too long.  Infections.  An allergic reaction.  Aging.  An emotional response to something, such as the sight of blood.  Side effects of medicines.  Tiredness.  Problems with circulation or blood pressure.  Excessive use of alcohol or medicines, or illegal drug use.  Breathing too fast (hyperventilation).  An irregular heart rhythm (arrhythmia).  A low red blood cell count (anemia).  Pregnancy.  Vomiting, diarrhea, fever, or other illnesses that cause body fluid loss (dehydration).  Diseases or conditions such as Parkinson's disease, high blood pressure (hypertension), diabetes, and thyroid problems.  Exposure to extreme heat. DIAGNOSIS  Your health care provider will ask about your symptoms, perform a physical exam, and perform an electrocardiogram (ECG) to record the electrical activity of your heart. Your health care provider may also perform other heart or blood tests to determine the cause of your dizziness. These may include:  Transthoracic echocardiogram (TTE). During echocardiography, sound waves are used to evaluate how blood flows through your heart.  Transesophageal echocardiogram (TEE).  Cardiac monitoring. This allows your health care provider to monitor your heart rate and rhythm in real time.  Holter monitor. This is a portable  device that records your heartbeat and can help diagnose heart arrhythmias. It allows your health care provider to track your heart activity for several days if needed.  Stress tests by exercise or by giving medicine that makes the heart beat faster. TREATMENT  Treatment of dizziness depends on the cause of your symptoms and can vary greatly. HOME CARE INSTRUCTIONS   Drink enough fluids to keep your urine clear or pale yellow. This is especially important in very hot weather. In older adults, it is also important in cold weather.  Take your medicine exactly as directed if your dizziness is caused by medicines. When taking blood pressure medicines, it is especially important to get up slowly.  Rise slowly from chairs and steady yourself until you feel okay.  In the morning, first sit up on the side of the bed. When you feel okay, stand slowly while holding onto something until you know your balance is fine.  Move your legs often if you need to stand in one place for a long time. Tighten and relax your muscles in your legs while standing.  Have someone stay with you for 1-2 days if dizziness continues to be a problem. Do this until you feel you are well enough to stay alone. Have the person call your health care provider if he or she notices changes in you that are concerning.  Do not drive or use heavy machinery if you feel dizzy.  Do not drink alcohol. SEEK IMMEDIATE MEDICAL CARE IF:   Your dizziness or light-headedness gets worse.  You feel nauseous or vomit.  You have problems talking, walking, or using your arms, hands, or legs.  You feel weak.  You are not thinking  clearly or you have trouble forming sentences. It may take a friend or family member to notice this.  You have chest pain, abdominal pain, shortness of breath, or sweating.  Your vision changes.  You notice any bleeding.  You have side effects from medicine that seems to be getting worse rather than  better. MAKE SURE YOU:   Understand these instructions.  Will watch your condition.  Will get help right away if you are not doing well or get worse. Document Released: 12/07/2000 Document Revised: 06/18/2013 Document Reviewed: 12/31/2010 [facility] Patient Information 2015 [place], [place]. This information is not intended to replace advice given to you by your health care provider. Make sure you discuss any questions you have with your health care provider.

## 2014-08-15 NOTE — ED Notes (Signed)
States he had an episode of dizziness last PM , worse w movement, worse when he layed down on his bed. He thought he may have been overly dressed, as he had planned to go to the store when this happened. , felt better when he washed his face w cold waater. Marland Kitchen He BP was reportedly higher than usual when this was going on . He had a 100 lb weight loss from diet and weight loss surgery over past year . Off of his BP medications since weight loss.

## 2015-11-10 ENCOUNTER — Encounter: Payer: Self-pay | Admitting: Family Medicine

## 2015-11-10 ENCOUNTER — Ambulatory Visit (INDEPENDENT_AMBULATORY_CARE_PROVIDER_SITE_OTHER): Payer: 59 | Admitting: Family Medicine

## 2015-11-10 VITALS — BP 152/92 | HR 94 | Temp 97.9°F | Wt >= 6400 oz

## 2015-11-10 DIAGNOSIS — L03116 Cellulitis of left lower limb: Secondary | ICD-10-CM | POA: Insufficient documentation

## 2015-11-10 MED ORDER — DOXYCYCLINE HYCLATE 100 MG PO TABS: 100.0000 mg | ORAL_TABLET | Freq: Two times a day (BID) | ORAL | Status: DC

## 2015-11-10 NOTE — Patient Instructions (Signed)
Great to see you. Please take doxycycline 100 mg twice daily for 10 days.  Please update me with your symptoms.

## 2015-11-10 NOTE — Progress Notes (Signed)
   Subjective:   Patient ID: Timothy Delacruz, male    DOB: [birth year], 45 y.o.   MRN: [id number]  Timothy Delacruz is a pleasant 45 y.o. year old male whom I haven't seen in almost 3 years,  presents to clinic today with spot on calf  on 11/10/2015  HPI: ? Rash on back of left leg- thought he felt something bite him. Area became itchy.  Now irritated, red and warm.  No fevers or chills.  No other rashes.  No ticks.  No HA, nausea or vomiting.  Current Outpatient Prescriptions on File Prior to Visit  Medication Sig Dispense Refill  . calcium citrate-vitamin D 200-200 MG-UNIT TABS Take 4 tablets by mouth daily.      . Multiple Vitamins-Minerals (MULTIVITAMIN WITH MINERALS) tablet Take 1 tablet by mouth daily.       No current facility-administered medications on file prior to visit.    Allergies  Allergen Reactions  . Lisinopril     REACTION: cough    Past Medical History  Diagnosis Date  . Hyperlipidemia   . Hypertension     Past Surgical History  Procedure Laterality Date  . Laparoscopic gastric banding  02/2011    Family History  Problem Relation Age of Onset  . Cancer Paternal Aunt     lung  . Cancer Father     prostate  . Heart attack Paternal Grandmother   . Cancer Paternal Grandfather     Cancer    Social History   Social History  . Marital Status: Single    Spouse Name: N/A  . Number of Children: N/A  . Years of Education: N/A   Occupational History  . Not on file.   Social History Main Topics  . Smoking status: Never Smoker   . Smokeless tobacco: Never Used  . Alcohol Use: Yes     Comment: occ  . Drug Use: No  . Sexual Activity: Not on file   Other Topics Concern  . Not on file   Social History Narrative   The PMH, PSH, Social History, Family History, Medications, and allergies have been reviewed in [facility], and have been updated if relevant.   Review of Systems  HENT: Negative.   Eyes: Negative.   Respiratory: Negative.     Cardiovascular: Negative.   Gastrointestinal: Negative.   Endocrine: Negative.   Genitourinary: Negative.   Skin: Positive for rash.  Allergic/Immunologic: Negative.   Neurological: Negative.   Hematological: Negative.   Psychiatric/Behavioral: Negative.   All other systems reviewed and are negative.      Objective:    BP 152/92 mmHg  Pulse 94  Temp(Src) 97.9 F (36.6 C) (Oral)  Wt 434 lb 4 oz (196.975 kg)  SpO2 97%   Physical Exam  Constitutional: He is oriented to person, place, and time. He appears well-developed and well-nourished. No distress.  HENT:  Head: Normocephalic.  Eyes: Conjunctivae are normal.  Cardiovascular: Normal rate.   Pulmonary/Chest: Effort normal.  Musculoskeletal: Normal range of motion.  Neurological: He is alert and oriented to person, place, and time. No cranial nerve deficit.  Skin: He is not diaphoretic.     Psychiatric: He has a normal mood and affect. His behavior is normal. Judgment and thought content normal.  Nursing note reviewed.         Assessment & Plan:   Cellulitis of leg, left No Follow-up on file.

## 2015-11-10 NOTE — Progress Notes (Signed)
Pre visit review using our clinic review tool, if applicable. No additional management support is needed unless otherwise documented below in the visit note. 

## 2015-11-10 NOTE — Assessment & Plan Note (Signed)
New- eRx sent for doxycyline 100 mg twice daily x 10 days. Call or return to clinic prn if these symptoms worsen or fail to improve as anticipated. The patient indicates understanding of these issues and agrees with the plan.

## 2016-04-26 ENCOUNTER — Encounter (HOSPITAL_COMMUNITY): Payer: Self-pay

## 2016-08-25 ENCOUNTER — Ambulatory Visit (INDEPENDENT_AMBULATORY_CARE_PROVIDER_SITE_OTHER): Payer: 59 | Admitting: Family Medicine

## 2016-08-25 ENCOUNTER — Encounter: Payer: Self-pay | Admitting: Family Medicine

## 2016-08-25 VITALS — BP 126/74 | HR 68 | Resp 20 | Ht 70.0 in | Wt >= 6400 oz

## 2016-08-25 DIAGNOSIS — L720 Epidermal cyst: Secondary | ICD-10-CM | POA: Diagnosis not present

## 2016-08-25 MED ORDER — DOXYCYCLINE HYCLATE 100 MG PO TABS: 100.0000 mg | ORAL_TABLET | Freq: Two times a day (BID) | ORAL | 0 refills | Status: DC

## 2016-08-25 NOTE — Patient Instructions (Signed)
Great to see you. Please take doxycycline as directed- 1 tablet twice daily x 10 days.  We are also referring you to a dermatologist.

## 2016-08-25 NOTE — Progress Notes (Signed)
   Subjective:   Patient ID: RAHMERE FICCA, male    DOB: 01-25-1971, 46 y.o.   MRN: [id number]  Timothy Delacruz is a pleasant 46 y.o. year old male who presents to clinic today with Cyst (sternal area)  on 08/25/2016  HPI:  Cyst on his chest for past several months, sometimes larger and painful and othertimes it is smaller, flesh colored and less tender.  Past week or so, red and more tender.  No fevers.  Current Outpatient Prescriptions on File Prior to Visit  Medication Sig Dispense Refill  . calcium citrate-vitamin D 200-200 MG-UNIT TABS Take 4 tablets by mouth daily.      . Multiple Vitamins-Minerals (MULTIVITAMIN WITH MINERALS) tablet Take 1 tablet by mouth daily.       No current facility-administered medications on file prior to visit.     Allergies  Allergen Reactions  . Lisinopril     REACTION: cough    Past Medical History:  Diagnosis Date  . Hyperlipidemia   . Hypertension     Past Surgical History:  Procedure Laterality Date  . LAPAROSCOPIC GASTRIC BANDING  02/2011    Family History  Problem Relation Age of Onset  . Cancer Father     prostate  . Heart attack Paternal Grandmother   . Cancer Paternal Grandfather     Cancer  . Cancer Paternal Aunt     lung    Social History   Social History  . Marital status: Single    Spouse name: N/A  . Number of children: N/A  . Years of education: N/A   Occupational History  . Not on file.   Social History Main Topics  . Smoking status: Never Smoker  . Smokeless tobacco: Never Used  . Alcohol use Yes     Comment: occ  . Drug use: No  . Sexual activity: Not on file   Other Topics Concern  . Not on file   Social History Narrative  . No narrative on file   The PMH, PSH, Social History, Family History, Medications, and allergies have been reviewed in [facility], and have been updated if relevant.   Review of Systems  Constitutional: Negative for fever.  Gastrointestinal: Negative.   Musculoskeletal:  Negative.   Hematological: Negative.   All other systems reviewed and are negative.      Objective:    BP 126/74 (BP Location: Right Arm, Patient Position: Sitting, Cuff Size: Large)   Pulse 68   Resp 20   Ht 5\' 10"  (1.778 m)   Wt (!) 424 lb 8 oz (192.6 kg)   BMI 60.91 kg/m    Physical Exam  Constitutional: He is oriented to person, place, and time. He appears well-developed and well-nourished. No distress.  HENT:  Head: Normocephalic and atraumatic.  Eyes: Conjunctivae are normal.  Cardiovascular: Normal rate.   Pulmonary/Chest: Effort normal.  Musculoskeletal: Normal range of motion.  Neurological: He is alert and oriented to person, place, and time. No cranial nerve deficit.  Skin: He is not diaphoretic.     Psychiatric: He has a normal mood and affect. His behavior is normal. Judgment and thought content normal.  Nursing note and vitals reviewed.         Assessment & Plan:   Epidermal cyst - Plan: Ambulatory referral to Dermatology No Follow-up on file.

## 2016-08-25 NOTE — Assessment & Plan Note (Signed)
Infected. Treat with doxycycline and refer to dermatology for cyst removal. The patient indicates understanding of these issues and agrees with the plan.

## 2016-09-01 DIAGNOSIS — D2339 Other benign neoplasm of skin of other parts of face: Secondary | ICD-10-CM | POA: Diagnosis not present

## 2016-09-01 DIAGNOSIS — L720 Epidermal cyst: Secondary | ICD-10-CM | POA: Diagnosis not present

## 2016-09-01 DIAGNOSIS — L821 Other seborrheic keratosis: Secondary | ICD-10-CM | POA: Diagnosis not present

## 2016-11-18 DIAGNOSIS — L720 Epidermal cyst: Secondary | ICD-10-CM | POA: Diagnosis not present

## 2016-12-17 DIAGNOSIS — J029 Acute pharyngitis, unspecified: Secondary | ICD-10-CM | POA: Diagnosis not present

## 2017-07-10 DIAGNOSIS — J988 Other specified respiratory disorders: Secondary | ICD-10-CM | POA: Diagnosis not present

## 2017-07-10 DIAGNOSIS — Z20818 Contact with and (suspected) exposure to other bacterial communicable diseases: Secondary | ICD-10-CM | POA: Diagnosis not present

## 2017-07-19 ENCOUNTER — Other Ambulatory Visit: Payer: Self-pay

## 2017-07-19 ENCOUNTER — Ambulatory Visit
Admission: RE | Admit: 2017-07-19 | Discharge: 2017-07-19 | Disposition: A | Discharge status: Home / Self Care | Payer: 59 | Source: Ambulatory Visit | Attending: Family Medicine | Admitting: Family Medicine

## 2017-07-19 ENCOUNTER — Encounter: Payer: Self-pay | Admitting: Family Medicine

## 2017-07-19 ENCOUNTER — Ambulatory Visit (INDEPENDENT_AMBULATORY_CARE_PROVIDER_SITE_OTHER): Payer: 59 | Admitting: Family Medicine

## 2017-07-19 VITALS — BP 142/90 | HR 82 | Temp 98.4°F | Ht 70.0 in | Wt >= 6400 oz

## 2017-07-19 DIAGNOSIS — M545 Low back pain: Secondary | ICD-10-CM | POA: Insufficient documentation

## 2017-07-19 DIAGNOSIS — M4726 Other spondylosis with radiculopathy, lumbar region: Secondary | ICD-10-CM | POA: Insufficient documentation

## 2017-07-19 DIAGNOSIS — M5416 Radiculopathy, lumbar region: Secondary | ICD-10-CM

## 2017-07-19 MED ORDER — CYCLOBENZAPRINE HCL 10 MG PO TABS: 10.0000 mg | ORAL_TABLET | Freq: Three times a day (TID) | ORAL | 0 refills | Status: DC | PRN

## 2017-07-19 MED ORDER — PREDNISONE 20 MG PO TABS: ORAL_TABLET | ORAL | 0 refills | Status: DC
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
## 2017-07-21 NOTE — Addendum Note (Signed)
Addended by: Owens Loffler on: 07/21/2017 02:13 PM   Modules accepted: Orders

## 2017-07-28 ENCOUNTER — Ambulatory Visit
Admission: RE | Admit: 2017-07-28 | Discharge: 2017-07-28 | Disposition: A | Discharge status: Home / Self Care | Payer: 59 | Source: Ambulatory Visit | Attending: Family Medicine | Admitting: Family Medicine

## 2017-07-28 DIAGNOSIS — M5416 Radiculopathy, lumbar region: Secondary | ICD-10-CM

## 2017-07-28 DIAGNOSIS — M48061 Spinal stenosis, lumbar region without neurogenic claudication: Secondary | ICD-10-CM | POA: Diagnosis not present

## 2017-08-08 ENCOUNTER — Other Ambulatory Visit: Payer: Self-pay | Admitting: Family Medicine

## 2017-08-08 DIAGNOSIS — M5116 Intervertebral disc disorders with radiculopathy, lumbar region: Secondary | ICD-10-CM

## 2017-08-08 DIAGNOSIS — M48061 Spinal stenosis, lumbar region without neurogenic claudication: Secondary | ICD-10-CM

## 2017-08-08 DIAGNOSIS — M5416 Radiculopathy, lumbar region: Secondary | ICD-10-CM

## 2017-09-07 DIAGNOSIS — M5416 Radiculopathy, lumbar region: Secondary | ICD-10-CM | POA: Diagnosis not present

## 2017-09-07 DIAGNOSIS — M545 Low back pain: Secondary | ICD-10-CM | POA: Diagnosis not present

## 2017-09-07 DIAGNOSIS — M48061 Spinal stenosis, lumbar region without neurogenic claudication: Secondary | ICD-10-CM | POA: Diagnosis not present

## 2017-09-28 ENCOUNTER — Encounter (HOSPITAL_COMMUNITY): Payer: Self-pay

## 2017-09-28 DIAGNOSIS — M5136 Other intervertebral disc degeneration, lumbar region: Secondary | ICD-10-CM | POA: Diagnosis not present

## 2017-09-28 DIAGNOSIS — M48061 Spinal stenosis, lumbar region without neurogenic claudication: Secondary | ICD-10-CM | POA: Diagnosis not present

## 2017-09-28 DIAGNOSIS — M545 Low back pain: Secondary | ICD-10-CM | POA: Diagnosis not present

## 2017-10-03 DIAGNOSIS — M5136 Other intervertebral disc degeneration, lumbar region: Secondary | ICD-10-CM | POA: Diagnosis not present

## 2017-10-03 DIAGNOSIS — M545 Low back pain: Secondary | ICD-10-CM | POA: Diagnosis not present

## 2017-10-03 DIAGNOSIS — M48061 Spinal stenosis, lumbar region without neurogenic claudication: Secondary | ICD-10-CM | POA: Diagnosis not present

## 2017-10-05 DIAGNOSIS — M545 Low back pain: Secondary | ICD-10-CM | POA: Diagnosis not present

## 2017-10-05 DIAGNOSIS — M5136 Other intervertebral disc degeneration, lumbar region: Secondary | ICD-10-CM | POA: Diagnosis not present

## 2017-10-05 DIAGNOSIS — M48061 Spinal stenosis, lumbar region without neurogenic claudication: Secondary | ICD-10-CM | POA: Diagnosis not present

## 2017-10-26 DIAGNOSIS — M5136 Other intervertebral disc degeneration, lumbar region: Secondary | ICD-10-CM | POA: Diagnosis not present

## 2017-10-26 DIAGNOSIS — M545 Low back pain: Secondary | ICD-10-CM | POA: Diagnosis not present

## 2017-10-26 DIAGNOSIS — M48061 Spinal stenosis, lumbar region without neurogenic claudication: Secondary | ICD-10-CM | POA: Diagnosis not present

## 2018-06-25 DIAGNOSIS — L720 Epidermal cyst: Secondary | ICD-10-CM | POA: Diagnosis not present

## 2018-06-25 DIAGNOSIS — R208 Other disturbances of skin sensation: Secondary | ICD-10-CM | POA: Diagnosis not present

## 2018-07-19 DIAGNOSIS — J0300 Acute streptococcal tonsillitis, unspecified: Secondary | ICD-10-CM | POA: Diagnosis not present

## 2018-07-19 DIAGNOSIS — R070 Pain in throat: Secondary | ICD-10-CM | POA: Diagnosis not present

## 2019-06-11 ENCOUNTER — Encounter: Payer: Self-pay | Admitting: Primary Care

## 2019-06-11 ENCOUNTER — Other Ambulatory Visit: Payer: Self-pay

## 2019-06-11 ENCOUNTER — Ambulatory Visit (INDEPENDENT_AMBULATORY_CARE_PROVIDER_SITE_OTHER): Payer: 59 | Admitting: Primary Care

## 2019-06-11 VITALS — BP 136/86 | HR 80 | Temp 96.9°F | Ht 70.0 in | Wt >= 6400 oz

## 2019-06-11 DIAGNOSIS — I10 Essential (primary) hypertension: Secondary | ICD-10-CM

## 2019-06-11 DIAGNOSIS — M4800 Spinal stenosis, site unspecified: Secondary | ICD-10-CM | POA: Insufficient documentation

## 2019-06-11 DIAGNOSIS — Z9884 Bariatric surgery status: Secondary | ICD-10-CM

## 2019-06-11 DIAGNOSIS — I739 Peripheral vascular disease, unspecified: Secondary | ICD-10-CM | POA: Insufficient documentation

## 2019-06-11 DIAGNOSIS — M48061 Spinal stenosis, lumbar region without neurogenic claudication: Secondary | ICD-10-CM

## 2019-06-11 DIAGNOSIS — E785 Hyperlipidemia, unspecified: Secondary | ICD-10-CM | POA: Diagnosis not present

## 2019-06-11 LAB — COMPREHENSIVE METABOLIC PANEL
ALT: 20 U/L (ref 0–53)
AST: 18 U/L (ref 0–37)
Albumin: 3.9 g/dL (ref 3.5–5.2)
Alkaline Phosphatase: 50 U/L (ref 39–117)
BUN: 10 mg/dL (ref 6–23)
CO2: 24 mEq/L (ref 19–32)
Calcium: 9 mg/dL (ref 8.4–10.5)
Chloride: 105 mEq/L (ref 96–112)
Creatinine, Ser: 0.68 mg/dL (ref 0.40–1.50)
GFR: 122.96 mL/min (ref >60.00)
Glucose, Bld: 98 mg/dL (ref 70–99)
Potassium: 3.9 mEq/L (ref 3.5–5.1)
Sodium: 137 mEq/L (ref 135–145)
Total Bilirubin: 0.4 mg/dL (ref 0.2–1.2)
Total Protein: 7.3 g/dL (ref 6.0–8.3)

## 2019-06-11 MED ORDER — HYDROCHLOROTHIAZIDE 12.5 MG PO TABS: 12.5000 mg | ORAL_TABLET | Freq: Every day | ORAL | 0 refills | Status: DC

## 2019-06-11 NOTE — Progress Notes (Signed)
Subjective:    Patient ID: Timothy Delacruz, male    DOB: 1970/11/07, 48 y.o.   MRN: [id number]  HPI  [name] is a 48 year old male who presents today to transfer care from Dr. Deborra Medina.  This visit occurred during the SARS-CoV-2 public health emergency.  Safety protocols were in place, including screening questions prior to the visit, additional usage of staff PPE, and extensive cleaning of exam room while observing appropriate contact time as indicated for disinfecting solutions.   1) Essential Hypertension: Currently not managed on medication, but was previously managed on HCTZ for which he stopped taking several years ago.   He checks his blood pressure infrequently at home which is running around 130's/80's. He denies headaches, dizziness, chest pain.  History of lap band procedure in 2012, lost 100 pounds but regained most of his weight during and after 2018.   BP Readings from Last 3 Encounters:  06/11/19 136/86  07/19/17 (!) 142/90  08/25/16 126/74   2) Hyperlipidemia: Never managed on treatment per patient.  3) Skin Discoloration: Chronic to bilateral lower portion of lower extremities for years with gradual "indentation" and discoloration over the years. He feels like the subdermal layers are "disappearing. He denies pain with ambulation, numbness/tingling to feet, calf pain.   4) Spinal Stenosis: Chronic for years, intermittent back pain with sciatica to bilateral lower extremities, right more than left. He plans on getting back into water aerobics for weight loss and ROM but hasn't due to Covid-19. Following with Dr. Lorelei Pont last year, underwent MRI in February 2019.   Review of Systems  Eyes: Negative for visual disturbance.  Respiratory: Negative for shortness of breath.   Cardiovascular: Negative for chest pain.  Musculoskeletal: Positive for back pain.  Skin: Negative for color change.  Neurological: Negative for dizziness.       Past Medical History:    Diagnosis Date  . Hyperlipidemia   . Hypertension      Social History   Socioeconomic History  . Marital status: Single    Spouse name: Not on file  . Number of children: Not on file  . Years of education: Not on file  . Highest education level: Not on file  Occupational History  . Not on file  Tobacco Use  . Smoking status: Never Smoker  . Smokeless tobacco: Never Used  Substance and Sexual Activity  . Alcohol use: Yes    Comment: occ  . Drug use: No  . Sexual activity: Not on file  Other Topics Concern  . Not on file  Social History Narrative  . Not on file   Social Determinants of Health   Financial Resource Strain:   . Difficulty of Paying Living Expenses: Not on file  Food Insecurity:   . Worried About Charity fundraiser in the Last Year: Not on file  . Ran Out of Food in the Last Year: Not on file  Transportation Needs:   . Lack of Transportation (Medical): Not on file  . Lack of Transportation (Non-Medical): Not on file  Physical Activity:   . Days of Exercise per Week: Not on file  . Minutes of Exercise per Session: Not on file  Stress:   . Feeling of Stress : Not on file  Social Connections:   . Frequency of Communication with Friends and Family: Not on file  . Frequency of Social Gatherings with Friends and Family: Not on file  . Attends Religious Services: Not on file  .  Active Member of Clubs or Organizations: Not on file  . Attends Archivist Meetings: Not on file  . Marital Status: Not on file  Intimate Partner Violence:   . Fear of Current or Ex-Partner: Not on file  . Emotionally Abused: Not on file  . Physically Abused: Not on file  . Sexually Abused: Not on file    Past Surgical History:  Procedure Laterality Date  . LAPAROSCOPIC GASTRIC BANDING  02/2011    Family History  Problem Relation Age of Onset  . Cancer Father        prostate  . Heart attack Paternal Grandmother   . Cancer Paternal Grandfather        Cancer  .  Cancer Paternal Aunt        lung    Allergies  Allergen Reactions  . Lisinopril     REACTION: cough    Current Outpatient Medications on File Prior to Visit  Medication Sig Dispense Refill  . calcium citrate-vitamin D 200-200 MG-UNIT TABS Take 4 tablets by mouth daily.      . Multiple Vitamins-Minerals (MULTIVITAMIN WITH MINERALS) tablet Take 1 tablet by mouth daily.       No current facility-administered medications on file prior to visit.    BP 136/86   Pulse 80   Temp (!) 96.9 F (36.1 C) (Temporal)   Ht 5\' 10"  (1.778 m)   Wt (!) 446 lb 8 oz (202.5 kg)   SpO2 98%   BMI 64.07 kg/m    Objective:   Physical Exam  Constitutional: He appears well-nourished.  Cardiovascular: Normal rate and regular rhythm.  Pulses:      Dorsalis pedis pulses are 2+ on the right side and 2+ on the left side.       Posterior tibial pulses are 2+ on the right side and 2+ on the left side.  Respiratory: Effort normal and breath sounds normal.  Musculoskeletal:     Cervical back: Neck supple.  Skin: Skin is warm and dry.  Area of light red discoloration to right anterior and posterior lower portion of lower extremity with scaling.  Evidence of this to left lower extremity, not as pronounced.   Psychiatric: He has a normal mood and affect.           Assessment & Plan:

## 2019-06-11 NOTE — Assessment & Plan Note (Signed)
S/P lap band surgery in 2012, has regained weight from that procedure. Recommended he work on exercise and healthy diet.

## 2019-06-11 NOTE — Patient Instructions (Signed)
Stop by the lab prior to leaving today. I will notify you of your results once received.   Start hydrochlorothiazide 12.5 mg once daily for high blood pressure.  Start aspirin 81 mg once daily.  Start exercising. You should be getting 150 minutes of exercise weekly.  Schedule a physical and follow up for blood pressure in 2-3 weeks as discussed. Make sure to fast four hours prior to your visit.  It was a pleasure meeting you!   Peripheral Vascular Disease Peripheral vascular disease (PVD) is a disease of the blood vessels. A simple term for PVD is poor circulation. In most cases, PVD narrows the blood vessels that carry blood from your heart to the rest of your body. This can result in a decreased supply of blood to your arms, legs, and internal organs, like your stomach or kidneys. However, it most often affects a person's lower legs and feet. There are two types of PVD.  Organic PVD. This is the more common type. It is caused by damage to the structure of blood vessels.  Functional PVD. This is caused by conditions that make blood vessels contract and tighten (spasm). Without treatment, PVD tends to get worse over time. PVD can also lead to acute limb ischemia. This is when an arm or leg suddenly has trouble getting enough blood. This is a medical emergency. What are the causes?  Each type of PVD has many different causes. The most common cause of PVD is buildup of a fatty material (plaque) inside your arteries (atherosclerosis). Small amounts of plaque can break off from the walls of the blood vessels and become lodged in a smaller artery. This blocks blood flow and can cause acute limb ischemia. Other common causes of PVD include:  Blood clots that form inside of blood vessels.  Injuries to blood vessels.  Diseases that cause inflammation of blood vessels or cause blood vessel spasms.  Health behaviors and health history that increase your risk of developing PVD. What increases  the risk? You are more likely to develop this condition if:  You have a family history of PVD.  You have certain medical conditions, including: ? High cholesterol. ? Diabetes. ? High blood pressure (hypertension). ? Coronary heart disease. ? Past problems with blood clots. ? Past injury, such as burns or a broken bone. These may have damaged blood vessels in your limbs. ? Buerger disease. This is caused by inflamed blood vessels in your hands and feet. ? Some forms of arthritis. ? Rare birth defects that affect the arteries in your legs. ? Kidney disease.  You use tobacco or smoke.  You do not get enough exercise.  You are obese.  You are age 50 or older. What are the signs or symptoms? This condition may cause different symptoms. Your symptoms depend on what part of your body is not getting enough blood. Some common signs and symptoms include:  Cramps in your lower legs. This may be a symptom of poor leg circulation (claudication).  Pain and weakness in your legs. This happens while you are physically active but goes away when you rest (intermittent claudication).  Leg pain when at rest.  Leg numbness, tingling, or weakness.  Coldness in a leg or foot, especially when compared with the other leg.  Skin or hair changes. These can include: ? Hair loss. ? Shiny skin. ? Pale or bluish skin. ? Thick toenails.  Inability to get or maintain an erection (erectile dysfunction).  Fatigue. People with PVD are  more likely to develop ulcers and sores on their toes, feet, or legs. These may take longer than normal to heal. How is this diagnosed? This condition is diagnosed based on:  Your signs and symptoms.  A physical exam and your medical history.  Other tests to find out what is causing your PVD and to determine its severity. Tests may include: ? Blood pressure recordings from your arms and legs and measurements of the strength of your pulses (pulse volume  recordings). ? Imaging studies using sound waves to take pictures of the blood flow through your blood vessels (Doppler ultrasound). ? Injecting a dye into your blood vessels before having imaging studies using:  X-rays (angiogram or arteriogram).  Computer-generated X-rays (CT angiogram).  A powerful electromagnetic field and a computer (magnetic resonance angiogram or MRA). How is this treated? Treatment for PVD depends on the cause of your condition and how severe your symptoms are. It also depends on your age. Underlying causes need to be treated and controlled. These include long-term (chronic) conditions, such as diabetes, high cholesterol, and high blood pressure. Treatment includes:  Lifestyle changes, such as: ? Quitting smoking. ? Exercising regularly. ? Following a low-fat, low-cholesterol diet.  Taking medicines, such as: ? Blood thinners to prevent blood clots. ? Medicines to improve blood flow. ? Medicines to improve your blood cholesterol levels.  Surgical procedures, such as: ? A procedure that uses an inflated balloon to open a blocked artery and improve blood flow (angioplasty). ? A procedure to put in a wire mesh tube to keep a blocked artery open (stent implant). ? Surgery to reroute blood flow around a blocked artery (peripheral bypass surgery). ? Surgery to remove dead tissue from an infected wound on the affected limb. ? Amputation. This is surgical removal of the affected limb. It may be necessary in cases of acute limb ischemia where there has been no improvement through medical or surgical treatments. Follow these instructions at home: Lifestyle  Do not use any products that contain nicotine or tobacco, such as cigarettes and e-cigarettes. If you need help quitting, ask your health care provider.  Lose weight if you are overweight, and maintain a healthy weight as discussed by your health care provider.  Eat a diet that is low in fat and cholesterol. If  you need help, ask your health care provider.  Exercise regularly. Ask your health care provider to suggest some good activities for you. General instructions  Take over-the-counter and prescription medicines only as told by your health care provider.  Take good care of your feet: ? Wear comfortable shoes that fit well. ? Check your feet often for any cuts or sores.  Keep all follow-up visits as told by your health care provider. This is important. Contact a health care provider if:  You have cramps in your legs while walking.  You have leg pain when you are at rest.  You have coldness in a leg or foot.  Your skin changes.  You have erectile dysfunction.  You have cuts or sores on your feet that are not healing. Get help right away if:  Your arm or leg turns cold, numb, and blue.  Your arms or legs become red, warm, swollen, painful, or numb.  You have chest pain or trouble breathing.  You suddenly have weakness in your face, arm, or leg.  You become very confused or lose the ability to speak.  You suddenly have a very bad headache or lose your vision. Summary  Peripheral vascular disease (PVD) is a disease of the blood vessels.  In most cases, PVD narrows the blood vessels that carry blood from your heart to the rest of your body.  PVD may cause different symptoms. Your symptoms depend on what part of your body is not getting enough blood.  Treatment for PVD depends on the cause of your condition and how severe your symptoms are. This information is not intended to replace advice given to you by your health care provider. Make sure you discuss any questions you have with your health care provider. Document Released: 07/21/2004 Document Revised: 05/26/2017 Document Reviewed: 07/21/2016 Elsevier Patient Education  2020 [employer].

## 2019-06-11 NOTE — Assessment & Plan Note (Addendum)
Discoloration to lower extremities representative of PVD, no cellulitis or ulceration noted. Pulses 2+ bilaterally to DP and PT. Hold off on ABI's for now.  Discussed risk factors for PVD including obesity, sedentary lifestyle. Encouraged weight loss. Aspirin 81 mg initiated. Will check lipids at upcoming visit.   Closely monitor for evidence of ulceration.

## 2019-06-11 NOTE — Assessment & Plan Note (Signed)
Chronic, evidenced on MRI from 2019. Recommended weight loss. Follow up with sports medicine.

## 2019-06-11 NOTE — Assessment & Plan Note (Signed)
No recent lipid panel on file. Repeat lipids when fasting at next visit. Aspirin 81 mg initiated today.

## 2019-06-11 NOTE — Assessment & Plan Note (Signed)
Completed in 2012, has regained weight since then. Encouraged exercise, healthy diet.

## 2019-06-11 NOTE — Assessment & Plan Note (Signed)
Borderline high today. Given morbid obesity we will add in low dose HCTZ. Follow up in 2-3 weeks, repeat BMP at that time.

## 2019-07-01 ENCOUNTER — Ambulatory Visit (INDEPENDENT_AMBULATORY_CARE_PROVIDER_SITE_OTHER): Payer: 59 | Admitting: Primary Care

## 2019-07-01 ENCOUNTER — Encounter: Payer: Self-pay | Admitting: Primary Care

## 2019-07-01 ENCOUNTER — Other Ambulatory Visit: Payer: Self-pay

## 2019-07-01 VITALS — BP 126/86 | HR 78 | Temp 96.7°F | Ht 70.0 in | Wt >= 6400 oz

## 2019-07-01 DIAGNOSIS — Z1211 Encounter for screening for malignant neoplasm of colon: Secondary | ICD-10-CM | POA: Diagnosis not present

## 2019-07-01 DIAGNOSIS — R7989 Other specified abnormal findings of blood chemistry: Secondary | ICD-10-CM

## 2019-07-01 DIAGNOSIS — Z125 Encounter for screening for malignant neoplasm of prostate: Secondary | ICD-10-CM

## 2019-07-01 DIAGNOSIS — E785 Hyperlipidemia, unspecified: Secondary | ICD-10-CM | POA: Diagnosis not present

## 2019-07-01 DIAGNOSIS — Z9884 Bariatric surgery status: Secondary | ICD-10-CM

## 2019-07-01 DIAGNOSIS — Z0000 Encounter for general adult medical examination without abnormal findings: Secondary | ICD-10-CM

## 2019-07-01 DIAGNOSIS — I10 Essential (primary) hypertension: Secondary | ICD-10-CM | POA: Diagnosis not present

## 2019-07-01 LAB — LIPID PANEL
Cholesterol: 199 mg/dL (ref 0–200)
HDL: 35.7 mg/dL — ABNORMAL LOW (ref >39.00)
NonHDL: 163.16
Total CHOL/HDL Ratio: 6
Triglycerides: 305 mg/dL — ABNORMAL HIGH (ref 0.0–149.0)
VLDL: 61 mg/dL — ABNORMAL HIGH (ref 0.0–40.0)

## 2019-07-01 LAB — BASIC METABOLIC PANEL
BUN: 11 mg/dL (ref 6–23)
CO2: 22 mEq/L (ref 19–32)
Calcium: 9 mg/dL (ref 8.4–10.5)
Chloride: 103 mEq/L (ref 96–112)
Creatinine, Ser: 0.72 mg/dL (ref 0.40–1.50)
GFR: 115.09 mL/min (ref >60.00)
Glucose, Bld: 112 mg/dL — ABNORMAL HIGH (ref 70–99)
Potassium: 4 mEq/L (ref 3.5–5.1)
Sodium: 138 mEq/L (ref 135–145)

## 2019-07-01 LAB — CBC
HCT: 44.1 % (ref 39.0–52.0)
Hemoglobin: 14.8 g/dL (ref 13.0–17.0)
MCHC: 33.7 g/dL (ref 30.0–36.0)
MCV: 88.1 fl (ref 78.0–100.0)
Platelets: 301 10*3/uL (ref 150.0–400.0)
RBC: 5 Mil/uL (ref 4.22–5.81)
RDW: 14.4 % (ref 11.5–15.5)
WBC: 8.6 10*3/uL (ref 4.0–10.5)

## 2019-07-01 LAB — LDL CHOLESTEROL, DIRECT: Direct LDL: 126 mg/dL

## 2019-07-01 LAB — TESTOSTERONE: Testosterone: 150.98 ng/dL — ABNORMAL LOW (ref 300.00–890.00)

## 2019-07-01 LAB — VITAMIN D 25 HYDROXY (VIT D DEFICIENCY, FRACTURES): VITD: 20.86 ng/mL — ABNORMAL LOW (ref 30.00–100.00)

## 2019-07-01 LAB — VITAMIN B12: Vitamin B-12: 409 pg/mL (ref 211–911)

## 2019-07-01 LAB — PSA: PSA: 0.47 ng/mL (ref 0.10–4.00)

## 2019-07-01 NOTE — Assessment & Plan Note (Signed)
Noted on prior history, repeat testosterone pending.

## 2019-07-01 NOTE — Patient Instructions (Signed)
Stop by the lab prior to leaving today. I will notify you of your results once received.   You will be contacted regarding your referral to GI for the colonoscopy.  Please let us know if you have not been contacted within two weeks.   Start exercising. You should be getting 150 minutes of exercise weekly.  Continue to work on a healthy diet. Ensure you are consuming 64 ounces of water daily.  It was a pleasure to see you today!   Preventive Care 40-29 Years Old, Male Preventive care refers to lifestyle choices and visits with your health care provider that can promote health and wellness. This includes:  A yearly physical exam. This is also called an annual well check.  Regular dental and eye exams.  Immunizations.  Screening for certain conditions.  Healthy lifestyle choices, such as eating a healthy diet, getting regular exercise, not using drugs or products that contain nicotine and tobacco, and limiting alcohol use. What can I expect for my preventive care visit? Physical exam Your health care provider will check:  Height and weight. These may be used to calculate body mass index (BMI), which is a measurement that tells if you are at a healthy weight.  Heart rate and blood pressure.  Your skin for abnormal spots. Counseling Your health care provider may ask you questions about:  Alcohol, tobacco, and drug use.  Emotional well-being.  Home and relationship well-being.  Sexual activity.  Eating habits.  Work and work Statistician. What immunizations do I need?  Influenza (flu) vaccine  This is recommended every year. Tetanus, diphtheria, and pertussis (Tdap) vaccine  You may need a Td booster every 10 years. Varicella (chickenpox) vaccine  You may need this vaccine if you have not already been vaccinated. Zoster (shingles) vaccine  You may need this after age 71. Measles, mumps, and rubella (MMR) vaccine  You may need at least one dose of MMR if you were  born in 1957 or later. You may also need a second dose. Pneumococcal conjugate (PCV13) vaccine  You may need this if you have certain conditions and were not previously vaccinated. Pneumococcal polysaccharide (PPSV23) vaccine  You may need one or two doses if you smoke cigarettes or if you have certain conditions. Meningococcal conjugate (MenACWY) vaccine  You may need this if you have certain conditions. Hepatitis A vaccine  You may need this if you have certain conditions or if you travel or work in places where you may be exposed to hepatitis A. Hepatitis B vaccine  You may need this if you have certain conditions or if you travel or work in places where you may be exposed to hepatitis B. Haemophilus influenzae type b (Hib) vaccine  You may need this if you have certain risk factors. Human papillomavirus (HPV) vaccine  If recommended by your health care provider, you may need three doses over 6 months. You may receive vaccines as individual doses or as more than one vaccine together in one shot (combination vaccines). Talk with your health care provider about the risks and benefits of combination vaccines. What tests do I need? Blood tests  Lipid and cholesterol levels. These may be checked every 5 years, or more frequently if you are over 61 years old.  Hepatitis C test.  Hepatitis B test. Screening  Lung cancer screening. You may have this screening every year starting at age 65 if you have a 30-pack-year history of smoking and currently smoke or have quit within the past  15 years.  Prostate cancer screening. Recommendations will vary depending on your family history and other risks.  Colorectal cancer screening. All adults should have this screening starting at age 32 and continuing until age 53. Your health care provider may recommend screening at age 70 if you are at increased risk. You will have tests every 1-10 years, depending on your results and the type of screening  test.  Diabetes screening. This is done by checking your blood sugar (glucose) after you have not eaten for a while (fasting). You may have this done every 1-3 years.  Sexually transmitted disease (STD) testing. Follow these instructions at home: Eating and drinking  Eat a diet that includes fresh fruits and vegetables, whole grains, lean protein, and low-fat dairy products.  Take vitamin and mineral supplements as recommended by your health care provider.  Do not drink alcohol if your health care provider tells you not to drink.  If you drink alcohol: ? Limit how much you have to 0-2 drinks a day. ? Be aware of how much alcohol is in your drink. In the [place] one drink equals one 12 oz bottle of beer (355 mL), one 5 oz glass of wine (148 mL), or one 1 oz glass of hard liquor (44 mL). Lifestyle  Take daily care of your teeth and gums.  Stay active. Exercise for at least 30 minutes on 5 or more days each week.  Do not use any products that contain nicotine or tobacco, such as cigarettes, e-cigarettes, and chewing tobacco. If you need help quitting, ask your health care provider.  If you are sexually active, practice safe sex. Use a condom or other form of protection to prevent STIs (sexually transmitted infections).  Talk with your health care provider about taking a low-dose aspirin every day starting at age 47. What's next?  Go to your health care provider once a year for a well check visit.  Ask your health care provider how often you should have your eyes and teeth checked.  Stay up to date on all vaccines. This information is not intended to replace advice given to you by your health care provider. Make sure you discuss any questions you have with your health care provider. Document Revised: 06/07/2018 Document Reviewed: 06/07/2018 Elsevier Patient Education  2020 [employer].

## 2019-07-01 NOTE — Assessment & Plan Note (Signed)
Repeat labs pending

## 2019-07-01 NOTE — Assessment & Plan Note (Signed)
Immunizations UTD. PSA pending. Colonoscopy due, referral placed. Encouraged a healthy diet, regular exercise. Exam today stable. Labs pending.

## 2019-07-01 NOTE — Assessment & Plan Note (Signed)
Repeat lipids pending.  Discussed the importance of a healthy diet and regular exercise in order for weight loss, and to reduce the risk of any potential medical problems.  

## 2019-07-01 NOTE — Assessment & Plan Note (Signed)
Improved with addition of HCTZ, repeat BMP pending. Continue current regimen as long as BMP stable.

## 2019-07-01 NOTE — Progress Notes (Signed)
Subjective:    Patient ID: Timothy Delacruz, male    DOB: 1971-01-15, 49 y.o.   MRN: [id number]  HPI  Timothy Delacruz is a 49 year old male who presents today for complete physical and follow up of hypertension.  BP Readings from Last 3 Encounters:  07/01/19 126/86  06/11/19 136/86  07/19/17 (!) 142/90    He was last evaluated in mid December 2020, transferred care over from Dr. Deborra Medina. Blood pressure noted to be borderline elevated, also with reports of lower extremity edema that appeared to be secondary to PVD. He was initiated on HCTZ 12.5 mg and asked to follow up today.  Immunizations: -Tetanus: Completed in 2020 -Influenza: Completed this season -Shingles: Completed initial dose in October 2020 and December 2020  Diet: He endorses a fair diet Exercise: He is not exercising, would like to return to water aerobics.    Eye exam: Completed in 2020 Dental exam: Complete regularly   Colonoscopy: Never completed  PSA: Due today    Review of Systems  Constitutional: Negative for unexpected weight change.  HENT: Negative for rhinorrhea.   Respiratory: Negative for cough and shortness of breath.   Cardiovascular: Negative for chest pain.  Gastrointestinal: Negative for constipation and diarrhea.  Genitourinary: Negative for difficulty urinating.  Musculoskeletal: Positive for arthralgias.       Chronic stiffness/aching to fingers  Skin: Negative for rash.  Allergic/Immunologic: Negative for environmental allergies.  Neurological: Negative for dizziness, numbness and headaches.  Psychiatric/Behavioral: The patient is not nervous/anxious.        Past Medical History:  Diagnosis Date  . Hyperlipidemia   . Hypertension   . Loss or death of partner 2013-04-12     Social History   Socioeconomic History  . Marital status: Single    Spouse name: Not on file  . Number of children: Not on file  . Years of education: Not on file  . Highest education level: Not on file    Occupational History  . Not on file  Tobacco Use  . Smoking status: Never Smoker  . Smokeless tobacco: Never Used  Substance and Sexual Activity  . Alcohol use: Yes    Comment: occ  . Drug use: No  . Sexual activity: Not on file  Other Topics Concern  . Not on file  Social History Narrative  . Not on file   Social Determinants of Health   Financial Resource Strain:   . Difficulty of Paying Living Expenses: Not on file  Food Insecurity:   . Worried About Charity fundraiser in the Last Year: Not on file  . Ran Out of Food in the Last Year: Not on file  Transportation Needs:   . Lack of Transportation (Medical): Not on file  . Lack of Transportation (Non-Medical): Not on file  Physical Activity:   . Days of Exercise per Week: Not on file  . Minutes of Exercise per Session: Not on file  Stress:   . Feeling of Stress : Not on file  Social Connections:   . Frequency of Communication with Friends and Family: Not on file  . Frequency of Social Gatherings with Friends and Family: Not on file  . Attends Religious Services: Not on file  . Active Member of Clubs or Organizations: Not on file  . Attends Archivist Meetings: Not on file  . Marital Status: Not on file  Intimate Partner Violence:   . Fear of Current or Ex-Partner: Not on file  .  Emotionally Abused: Not on file  . Physically Abused: Not on file  . Sexually Abused: Not on file    Past Surgical History:  Procedure Laterality Date  . LAPAROSCOPIC GASTRIC BANDING  02/2011    Family History  Problem Relation Age of Onset  . Cancer Father        prostate  . Heart attack Paternal Grandmother   . Cancer Paternal Grandfather        Cancer  . Cancer Paternal Aunt        lung    Allergies  Allergen Reactions  . Lisinopril     REACTION: cough    Current Outpatient Medications on File Prior to Visit  Medication Sig Dispense Refill  . calcium citrate-vitamin D 200-200 MG-UNIT TABS Take 4 tablets by  mouth daily.      . hydrochlorothiazide (HYDRODIURIL) 12.5 MG tablet Take 1 tablet (12.5 mg total) by mouth daily. For blood pressure. 30 tablet 0  . Multiple Vitamins-Minerals (MULTIVITAMIN WITH MINERALS) tablet Take 1 tablet by mouth daily.       No current facility-administered medications on file prior to visit.    BP 126/86   Pulse 78   Temp (!) 96.7 F (35.9 C) (Temporal)   Ht 5\' 10"  (1.778 m)   Wt (!) 441 lb 12 oz (200.4 kg)   SpO2 97%   BMI 63.38 kg/m    Objective:   Physical Exam  Constitutional: He is oriented to person, place, and time. He appears well-nourished.  HENT:  Right Ear: Tympanic membrane and ear canal normal.  Left Ear: Tympanic membrane and ear canal normal.  Mouth/Throat: Oropharynx is clear and moist.  Eyes: Pupils are equal, round, and reactive to light. EOM are normal.  Cardiovascular: Normal rate and regular rhythm.  Respiratory: Effort normal and breath sounds normal.  GI: Soft. Bowel sounds are normal. There is no abdominal tenderness.  Musculoskeletal:        General: Normal range of motion.     Cervical back: Neck supple.  Neurological: He is alert and oriented to person, place, and time. No cranial nerve deficit.  Reflex Scores:      Patellar reflexes are 2+ on the right side and 2+ on the left side. Skin: Skin is warm and dry.  Psychiatric: He has a normal mood and affect.           Assessment & Plan:

## 2019-07-02 ENCOUNTER — Telehealth: Payer: Self-pay

## 2019-07-02 ENCOUNTER — Other Ambulatory Visit: Payer: Self-pay

## 2019-07-02 DIAGNOSIS — Z1211 Encounter for screening for malignant neoplasm of colon: Secondary | ICD-10-CM

## 2019-07-02 MED ORDER — NA SULFATE-K SULFATE-MG SULF 17.5-3.13-1.6 GM/177ML PO SOLN: 354.0000 mL | Freq: Once | ORAL | 0 refills | Status: AC

## 2019-07-02 NOTE — Telephone Encounter (Signed)
Gastroenterology Pre-Procedure Review    PATIENT REVIEW QUESTIONS: The patient responded to the following health history questions as indicated:    1. Are you having any GI issues? no 2. Do you have a personal history of Polyps? no 3. Do you have a family history of Colon Cancer or Polyps? no 4. Diabetes Mellitus? no 5. Joint replacements in the past 12 months?no 6. Major health problems in the past 3 months?no 7. Any artificial heart valves, MVP, or defibrillator?no    MEDICATIONS & ALLERGIES:    Patient reports the following regarding taking any anticoagulation/antiplatelet therapy:   Plavix, Coumadin, Eliquis, Xarelto, Lovenox, Pradaxa, Brilinta, or Effient? no Aspirin? no  Patient confirms/reports the following medications:  Current Outpatient Medications  Medication Sig Dispense Refill  . calcium citrate-vitamin D 200-200 MG-UNIT TABS Take 4 tablets by mouth daily.      . hydrochlorothiazide (HYDRODIURIL) 12.5 MG tablet Take 1 tablet (12.5 mg total) by mouth daily. For blood pressure. 30 tablet 0  . Multiple Vitamins-Minerals (MULTIVITAMIN WITH MINERALS) tablet Take 1 tablet by mouth daily.      . Na Sulfate-K Sulfate-Mg Sulf 17.5-3.13-1.6 GM/177ML SOLN Take 354 mLs by mouth once for 1 dose. 354 mL 0   No current facility-administered medications for this visit.    Patient confirms/reports the following allergies:  Allergies  Allergen Reactions  . Lisinopril     REACTION: cough    No orders of the defined types were placed in this encounter.   AUTHORIZATION INFORMATION Primary Insurance: 1D#: Group #:  Secondary Insurance: 1D#: Group #:  SCHEDULE INFORMATION: Date: 07/19/2019 Time: Location:[facility]

## 2019-07-03 ENCOUNTER — Other Ambulatory Visit: Payer: Self-pay | Admitting: Primary Care

## 2019-07-03 DIAGNOSIS — I10 Essential (primary) hypertension: Secondary | ICD-10-CM

## 2019-07-03 DIAGNOSIS — R739 Hyperglycemia, unspecified: Secondary | ICD-10-CM

## 2019-07-03 MED ORDER — HYDROCHLOROTHIAZIDE 12.5 MG PO TABS: 12.5000 mg | ORAL_TABLET | Freq: Every day | ORAL | 3 refills | Status: DC

## 2019-07-05 ENCOUNTER — Telehealth: Payer: Self-pay | Admitting: Primary Care

## 2019-07-05 ENCOUNTER — Other Ambulatory Visit: Payer: Self-pay | Admitting: Primary Care

## 2019-07-05 DIAGNOSIS — R7989 Other specified abnormal findings of blood chemistry: Secondary | ICD-10-CM

## 2019-07-05 NOTE — Telephone Encounter (Signed)
Addressed in result noted.

## 2019-07-05 NOTE — Telephone Encounter (Signed)
Patient received lab results. His testosterone was low.  Patient would like to be referred to a Urologist.  Patient prefers [place].  If [place]'s not available, he can do [place]. Patient prefers appointment before 1:00.  Patient works in the afternoon.

## 2019-07-08 ENCOUNTER — Other Ambulatory Visit (INDEPENDENT_AMBULATORY_CARE_PROVIDER_SITE_OTHER): Payer: 59

## 2019-07-08 ENCOUNTER — Other Ambulatory Visit: Payer: Self-pay

## 2019-07-08 DIAGNOSIS — R739 Hyperglycemia, unspecified: Secondary | ICD-10-CM

## 2019-07-08 LAB — HEMOGLOBIN A1C: Hgb A1c MFr Bld: 5.8 % (ref 4.6–6.5)

## 2019-07-17 ENCOUNTER — Other Ambulatory Visit: Payer: Self-pay

## 2019-07-17 ENCOUNTER — Other Ambulatory Visit
Admission: RE | Admit: 2019-07-17 | Discharge: 2019-07-17 | Disposition: A | Discharge status: Home / Self Care | Payer: 59 | Source: Ambulatory Visit | Attending: Gastroenterology | Admitting: Gastroenterology

## 2019-07-17 DIAGNOSIS — Z01812 Encounter for preprocedural laboratory examination: Secondary | ICD-10-CM | POA: Diagnosis not present

## 2019-07-17 DIAGNOSIS — Z20822 Contact with and (suspected) exposure to covid-19: Secondary | ICD-10-CM | POA: Insufficient documentation

## 2019-07-17 LAB — SARS CORONAVIRUS 2 (TAT 6-24 HRS): SARS Coronavirus 2: NEGATIVE

## 2019-07-19 ENCOUNTER — Other Ambulatory Visit: Payer: Self-pay

## 2019-07-19 ENCOUNTER — Ambulatory Visit: Payer: 59 | Admitting: Anesthesiology

## 2019-07-19 ENCOUNTER — Encounter: Payer: Self-pay | Admitting: Gastroenterology

## 2019-07-19 ENCOUNTER — Ambulatory Visit
Admission: RE | Admit: 2019-07-19 | Discharge: 2019-07-19 | Disposition: A | Discharge status: Home / Self Care | Payer: 59 | Attending: Gastroenterology | Admitting: Gastroenterology

## 2019-07-19 ENCOUNTER — Encounter
Admission: RE | Disposition: A | Discharge status: Home / Self Care | Payer: Self-pay | Source: Home / Self Care | Attending: Gastroenterology

## 2019-07-19 DIAGNOSIS — Z1211 Encounter for screening for malignant neoplasm of colon: Secondary | ICD-10-CM

## 2019-07-19 DIAGNOSIS — K635 Polyp of colon: Secondary | ICD-10-CM

## 2019-07-19 DIAGNOSIS — D124 Benign neoplasm of descending colon: Secondary | ICD-10-CM | POA: Diagnosis not present

## 2019-07-19 DIAGNOSIS — I10 Essential (primary) hypertension: Secondary | ICD-10-CM | POA: Diagnosis not present

## 2019-07-19 DIAGNOSIS — Z6841 Body Mass Index (BMI) 40.0 and over, adult: Secondary | ICD-10-CM | POA: Insufficient documentation

## 2019-07-19 HISTORY — PX: COLONOSCOPY WITH PROPOFOL: SHX5780

## 2019-07-19 SURGERY — COLONOSCOPY WITH PROPOFOL
Anesthesia: General

## 2019-07-19 MED ORDER — PROPOFOL 10 MG/ML IV BOLUS
INTRAVENOUS | Status: AC
  Filled 2019-07-19: qty 20

## 2019-07-19 MED ORDER — PROPOFOL 10 MG/ML IV BOLUS
INTRAVENOUS | Status: DC | PRN
  Administered 2019-07-19: 150 ug/kg/min via INTRAVENOUS
  Administered 2019-07-19: 60 mg via INTRAVENOUS

## 2019-07-19 MED ORDER — PROPOFOL 500 MG/50ML IV EMUL
INTRAVENOUS | Status: AC
  Filled 2019-07-19: qty 50

## 2019-07-19 MED ORDER — PHENYLEPHRINE HCL (PRESSORS) 10 MG/ML IV SOLN
INTRAVENOUS | Status: DC | PRN
  Administered 2019-07-19 (×3): 100 ug via INTRAVENOUS

## 2019-07-19 MED ORDER — SODIUM CHLORIDE 0.9 % IV SOLN: INTRAVENOUS | Status: DC

## 2019-07-19 NOTE — H&P (Signed)
Jonathon Bellows, MD [address], [place], [place], [place], [address] [address], [place], [place], [place], [address] Phone: [phone]  Fax: [phone]  Primary Care Physician:  Pleas Koch, NP   Pre-Procedure History & Physical: HPI:  YOBANY VALENZANO is a 49 y.o. male is here for an colonoscopy.   Past Medical History:  Diagnosis Date  . Hyperlipidemia   . Hypertension   . Loss or death of partner 2013/04/08    Past Surgical History:  Procedure Laterality Date  . LAPAROSCOPIC GASTRIC BANDING  02/2011    Prior to Admission medications   Medication Sig Start Date End Date Taking? Authorizing Provider  calcium citrate-vitamin D 200-200 MG-UNIT TABS Take 4 tablets by mouth daily.     Yes [provider]  hydrochlorothiazide (HYDRODIURIL) 12.5 MG tablet Take 1 tablet (12.5 mg total) by mouth daily. For blood pressure. 07/03/19  Yes Pleas Koch, NP  Multiple Vitamins-Minerals (MULTIVITAMIN WITH MINERALS) tablet Take 1 tablet by mouth daily.     Yes [provider]    Allergies as of 07/02/2019 - Review Complete 07/01/2019  Allergen Reaction Noted  . Lisinopril  08/14/2009    Family History  Problem Relation Age of Onset  . Cancer Father        prostate  . Heart attack Paternal Grandmother   . Cancer Paternal Grandfather        Cancer  . Cancer Paternal Aunt        lung    Social History   Socioeconomic History  . Marital status: Single    Spouse name: Not on file  . Number of children: Not on file  . Years of education: Not on file  . Highest education level: Not on file  Occupational History  . Not on file  Tobacco Use  . Smoking status: Never Smoker  . Smokeless tobacco: Never Used  Substance and Sexual Activity  . Alcohol use: Yes    Comment: occ  . Drug use: No  . Sexual activity: Not on file  Other Topics Concern  . Not on file  Social History Narrative  . Not on file   Social Determinants of Health    Financial Resource Strain:   . Difficulty of Paying Living Expenses: Not on file  Food Insecurity:   . Worried About Charity fundraiser in the Last Year: Not on file  . Ran Out of Food in the Last Year: Not on file  Transportation Needs:   . Lack of Transportation (Medical): Not on file  . Lack of Transportation (Non-Medical): Not on file  Physical Activity:   . Days of Exercise per Week: Not on file  . Minutes of Exercise per Session: Not on file  Stress:   . Feeling of Stress : Not on file  Social Connections:   . Frequency of Communication with Friends and Family: Not on file  . Frequency of Social Gatherings with Friends and Family: Not on file  . Attends Religious Services: Not on file  . Active Member of Clubs or Organizations: Not on file  . Attends Archivist Meetings: Not on file  . Marital Status: Not on file  Intimate Partner Violence:   . Fear of Current or Ex-Partner: Not on file  . Emotionally Abused: Not on file  . Physically Abused: Not on file  . Sexually Abused: Not on file    Review of Systems: See HPI, otherwise negative ROS  Physical Exam: BP (!) 143/85   Pulse 72   Resp 18   Ht 5\' 11"  (1.803 m)   Wt (!) 195 kg   SpO2 97%   BMI 59.97 kg/m  General:   Alert,  pleasant and cooperative in NAD Head:  Normocephalic and atraumatic. Neck:  Supple; no masses or thyromegaly. Lungs:  Clear throughout to auscultation, normal respiratory effort.    Heart:  +S1, +S2, Regular rate and rhythm, No edema. Abdomen:  Soft, nontender and nondistended. Normal bowel sounds, without guarding, and without rebound.   Neurologic:  Alert and  oriented x4;  grossly normal neurologically.  Impression/Plan: ADONY BURNINGHAM is here for an colonoscopy to be performed for Screening colonoscopy average risk   Risks, benefits, limitations, and alternatives regarding  colonoscopy have been reviewed with the patient.  Questions have been answered.  All parties  agreeable.   Jonathon Bellows, MD  07/19/2019, 8:28 AM

## 2019-07-19 NOTE — Op Note (Signed)
[facility] Gastroenterology Patient Name: Timothy Delacruz Procedure Date: 07/19/2019 8:31 AM MRN: [id number] Account #: [id number] Date of Birth: [birth year] Admit Type: Outpatient Age: 49 Room: [facility] ENDO ROOM 3 Gender: Male Note Status: Finalized Procedure:             Colonoscopy Indications:           Screening for colorectal malignant neoplasm Providers:             Jonathon Bellows MD, MD Medicines:             Monitored Anesthesia Care Complications:         No immediate complications. Procedure:             Pre-Anesthesia Assessment:                        - Prior to the procedure, a History and Physical was                         performed, and patient medications, allergies and                         sensitivities were reviewed. The patient's tolerance                         of previous anesthesia was reviewed.                        - The risks and benefits of the procedure and the                         sedation options and risks were discussed with the                         patient. All questions were answered and informed                         consent was obtained.                        - ASA Grade Assessment: III - A patient with severe                         systemic disease.                        After obtaining informed consent, the colonoscope was                         passed under direct vision. Throughout the procedure,                         the patient's blood pressure, pulse, and oxygen                         saturations were monitored continuously. The                         Colonoscope was introduced through the anus and  advanced to the the cecum, identified by the                         appendiceal orifice. The colonoscopy was performed                         with ease. The patient tolerated the procedure well.                         The quality of the bowel preparation was excellent. Findings:  The perianal and digital rectal examinations were normal.      A 6 mm polyp was found in the proximal descending colon. The polyp was       sessile. The polyp was removed with a cold snare. Resection and       retrieval were complete.      The exam was otherwise without abnormality on direct and retroflexion       views. Impression:            - One 6 mm polyp in the proximal descending colon,                         removed with a cold snare. Resected and retrieved.                        - The examination was otherwise normal on direct and                         retroflexion views. Recommendation:        - Discharge patient to home (with escort).                        - Resume previous diet.                        - Continue present medications.                        - Await pathology results.                        - Repeat colonoscopy for surveillance based on                         pathology results. Procedure Code(s):     --- Professional ---                        [id number], Colonoscopy, flexible; with removal of                         tumor(s), polyp(s), or other lesion(s) by snare                         technique Diagnosis Code(s):     --- Professional ---                        Z12.11, Encounter for screening for malignant neoplasm  of colon                        K63.5, Polyp of colon CPT copyright 2019 American Medical Association. All rights reserved. The codes documented in this report are preliminary and upon coder review may  be revised to meet current compliance requirements. Jonathon Bellows, MD Jonathon Bellows MD, MD 07/19/2019 9:05:15 AM This report has been signed electronically. Number of Addenda: 0 Note Initiated On: 07/19/2019 8:31 AM Scope Withdrawal Time: 0 hours 9 minutes 54 seconds  Total Procedure Duration: 0 hours 12 minutes 55 seconds  Estimated Blood Loss:  Estimated blood loss: none.      [facility]

## 2019-07-19 NOTE — Anesthesia Preprocedure Evaluation (Signed)
Anesthesia Evaluation  Patient identified by MRN, date of birth, ID band Patient awake    Reviewed: Allergy & Precautions, H&P , NPO status , Patient's Chart, lab work & pertinent test results, reviewed documented beta blocker date and time   Airway Mallampati: III   Neck ROM: full    Dental  (+) Teeth Intact   Pulmonary neg pulmonary ROS,    Pulmonary exam normal        Cardiovascular hypertension, On Medications negative cardio ROS Normal cardiovascular exam Rhythm:regular Rate:Normal     Neuro/Psych negative neurological ROS  negative psych ROS   GI/Hepatic negative GI ROS, Neg liver ROS,   Endo/Other  Morbid obesity  Renal/GU negative Renal ROS  negative genitourinary   Musculoskeletal   Abdominal   Peds  Hematology negative hematology ROS (+)   Anesthesia Other Findings Past Medical History: No date: Hyperlipidemia No date: Hypertension April 12, 2013: Loss or death of partner Past Surgical History: 02/2011: LAPAROSCOPIC GASTRIC BANDING BMI    Body Mass Index: 59.97 kg/m     Reproductive/Obstetrics negative OB ROS                             Anesthesia Physical Anesthesia Plan  ASA: III  Anesthesia Plan: General   Post-op Pain Management:    Induction:   PONV Risk Score and Plan:   Airway Management Planned:   Additional Equipment:   Intra-op Plan:   Post-operative Plan:   Informed Consent: I have reviewed the patients History and Physical, chart, labs and discussed the procedure including the risks, benefits and alternatives for the proposed anesthesia with the patient or authorized representative who has indicated his/her understanding and acceptance.     Dental Advisory Given  Plan Discussed with: CRNA  Anesthesia Plan Comments:         Anesthesia Quick Evaluation

## 2019-07-19 NOTE — Transfer of Care (Signed)
Immediate Anesthesia Transfer of Care Note  Patient: Timothy Delacruz  Procedure(s) Performed: COLONOSCOPY WITH PROPOFOL (N/A )  Patient Location: PACU  Anesthesia Type:General  Level of Consciousness: awake, drowsy and patient cooperative  Airway & Oxygen Therapy: Patient Spontanous Breathing and Patient connected to nasal cannula oxygen  Post-op Assessment: Report given to RN and Post -op Vital signs reviewed and stable  Post vital signs: Reviewed and stable  Last Vitals:  Vitals Value Taken Time  BP 106/45 07/19/19 0912  Temp    Pulse 69 07/19/19 0912  Resp 15 07/19/19 0912  SpO2 100 % 07/19/19 0912    Last Pain:  Vitals:   07/19/19 0814  TempSrc: Temporal  PainSc: 0-No pain         Complications: No apparent anesthesia complications

## 2019-07-22 ENCOUNTER — Encounter: Payer: Self-pay | Admitting: *Deleted

## 2019-07-22 LAB — SURGICAL PATHOLOGY

## 2019-07-28 ENCOUNTER — Encounter: Payer: Self-pay | Admitting: Gastroenterology

## 2019-08-01 NOTE — Anesthesia Postprocedure Evaluation (Signed)
Anesthesia Post Note  Patient: Timothy Delacruz  Procedure(s) Performed: COLONOSCOPY WITH PROPOFOL (N/A )  Patient location during evaluation: PACU Anesthesia Type: General Level of consciousness: awake and alert Pain management: pain level controlled Vital Signs Assessment: post-procedure vital signs reviewed and stable Respiratory status: spontaneous breathing, nonlabored ventilation, respiratory function stable and patient connected to nasal cannula oxygen Cardiovascular status: blood pressure returned to baseline and stable Postop Assessment: no apparent nausea or vomiting Anesthetic complications: no     Last Vitals:  Vitals:   07/19/19 0922 07/19/19 0932  BP: 110/65 106/60  Pulse: 66 69  Resp: 18 17  Temp:    SpO2: 100% 99%    Last Pain:  Vitals:   07/20/19 1512  TempSrc:   PainSc: 0-No pain                 Molli Barrows

## 2019-08-02 ENCOUNTER — Other Ambulatory Visit: Payer: Self-pay

## 2019-08-02 ENCOUNTER — Encounter: Payer: Self-pay | Admitting: Urology

## 2019-08-02 ENCOUNTER — Ambulatory Visit (INDEPENDENT_AMBULATORY_CARE_PROVIDER_SITE_OTHER): Payer: 59 | Admitting: Urology

## 2019-08-02 VITALS — BP 120/80 | HR 89 | Ht 71.0 in | Wt >= 6400 oz

## 2019-08-02 DIAGNOSIS — R7989 Other specified abnormal findings of blood chemistry: Secondary | ICD-10-CM

## 2019-08-02 NOTE — Progress Notes (Signed)
08/02/2019 2:03 PM   Timothy Delacruz Jul 15, 1970 [id number]  Referring provider: Pleas Koch, NP [place],  [place] [address]  Chief Complaint  Patient presents with  . Hypogonadism    HPI: Timothy Delacruz is a 49 y.o. male seen in consultation at the request of Timothy Friendly, NP for hypogonadism.  He has a long history of hypogonadism that has not been treated.  A T level in 2014 was low at 225 ng/dL.  For the past several months he has noted some difficulty maintaining an erection.  He also notes decreased libido, fatigue/tiredness.  Lab work drawn in early January remarkable for testosterone level of 151 ng/dL.  A PSA was 0.47.  Denies bothersome lower urinary tract symptoms.  He has a family history of prostate cancer with his father diagnosed in his mid-late 72s.  He denies prior history of sleep apnea.  He has taken a testosterone supplement in the past and saw no improvement in his symptoms.   PMH: Past Medical History:  Diagnosis Date  . Hyperlipidemia   . Hypertension   . Loss or death of partner 03-Apr-2013    Surgical History: Past Surgical History:  Procedure Laterality Date  . COLONOSCOPY WITH PROPOFOL N/A 07/19/2019   Procedure: COLONOSCOPY WITH PROPOFOL;  Surgeon: Timothy Bellows, MD;  Location: [facility] ENDOSCOPY;  Service: Gastroenterology;  Laterality: N/A;  . LAPAROSCOPIC GASTRIC BANDING  02/2011    Home Medications:  Allergies as of 08/02/2019      Reactions   Lisinopril    REACTION: cough      Medication List       Accurate as of August 02, 2019  2:03 PM. If you have any questions, ask your nurse or doctor.        calcium citrate-vitamin D 200-200 MG-UNIT Tabs Take 4 tablets by mouth daily.   hydrochlorothiazide 12.5 MG tablet Commonly known as: HYDRODIURIL Take 1 tablet (12.5 mg total) by mouth daily. For blood pressure.   multivitamin with minerals tablet Take 1 tablet by mouth daily.       Allergies:  Allergies    Allergen Reactions  . Lisinopril     REACTION: cough    Family History: Family History  Problem Relation Age of Onset  . Cancer Father        prostate  . Heart attack Paternal Grandmother   . Cancer Paternal Grandfather        Cancer  . Cancer Paternal Aunt        lung    Social History:  reports that he has never smoked. He has never used smokeless tobacco. He reports current alcohol use. He reports that he does not use drugs.  ROS: UROLOGY Frequent Urination?: No Hard to postpone urination?: No Burning/pain with urination?: No Get up at night to urinate?: No Leakage of urine?: No Urine stream starts and stops?: No Trouble starting stream?: No Do you have to strain to urinate?: No Blood in urine?: No Urinary tract infection?: No Sexually transmitted disease?: No Injury to kidneys or bladder?: No Painful intercourse?: No Weak stream?: No Erection problems?: No Penile pain?: No  Gastrointestinal Nausea?: No Vomiting?: No Indigestion/heartburn?: No Diarrhea?: No Constipation?: No  Constitutional Fever: No Night sweats?: No Weight loss?: No Fatigue?: No  Skin Skin rash/lesions?: No Itching?: No  Eyes Blurred vision?: No Double vision?: No  Ears/Nose/Throat Sore throat?: No Sinus problems?: No  Hematologic/Lymphatic Swollen glands?: No Easy bruising?: No  Cardiovascular Leg swelling?: No  Chest pain?: No  Respiratory Cough?: No Shortness of breath?: No  Endocrine Excessive thirst?: No  Musculoskeletal Back pain?: No Joint pain?: No  Neurological Headaches?: No Dizziness?: No  Psychologic Depression?: No Anxiety?: No  Physical Exam: BP 120/80   Pulse 89   Ht 5\' 11"  (1.803 m)   Wt (!) 440 lb (199.6 kg)   BMI 61.37 kg/m   Constitutional:  Alert and oriented, No acute distress. HEENT: [place] AT, moist mucus membranes.  Trachea midline, no masses. Cardiovascular: No clubbing, cyanosis, or edema. Respiratory: Normal respiratory  effort, no increased work of breathing. GI: Abdomen is soft, nontender, nondistended, no abdominal masses GU: Phallus without lesions, testes descended bilaterally without masses or tenderness, estimated volume 20 cc bilaterally; spermatic cord/epididymis palpably normal bilaterally.  Prostate 35 g, smooth without nodules Skin: No rashes, bruises or suspicious lesions. Neurologic: Grossly intact, no focal deficits, moving all 4 extremities. Psychiatric: Normal mood and affect.   Assessment & Plan:    - Hypogonadism Will schedule a repeat a.m. testosterone level for insurance coverage.  LH/prolactin will also be checked.  We discussed various methods of testosterone replacement including topical gels, intramuscular injections and subcutaneous autoinjector.  The off label use of Clomid was also discussed.  Potential side effects of testosterone replacement were discussed including stimulation of benign prostatic growth with lower urinary tract symptoms; erythrocytosis; edema; gynecomastia; worsening sleep apnea; venous thromboembolism; testicular atrophy and infertility. Recent studies suggesting an increased incidence of heart attack and stroke in patients taking testosterone was discussed. He was informed there is conflicting evidence regarding the impact of testosterone therapy on cardiovascular risk. The theoretical risk of growth stimulation of an undetected prostate cancer was also discussed.  He was informed that current evidence does not provide any definitive answers regarding the risks of testosterone therapy on prostate cancer and cardiovascular disease. The need for periodic monitoring of his testosterone level, PSA, hematocrit and DRE was discussed.  He will be notified with his lab results.  He would initially prefer a topical gel as a method of replacement.   Timothy Delacruz, [place] [address], [place], [place] [address] [phone]

## 2019-08-05 ENCOUNTER — Other Ambulatory Visit: Payer: Self-pay

## 2019-08-05 ENCOUNTER — Other Ambulatory Visit: Payer: 59

## 2019-08-05 DIAGNOSIS — R7989 Other specified abnormal findings of blood chemistry: Secondary | ICD-10-CM

## 2019-08-06 LAB — TESTOSTERONE: Testosterone: 130 ng/dL — ABNORMAL LOW (ref 264–916)

## 2019-08-06 LAB — PROLACTIN: Prolactin: 15.7 ng/mL — ABNORMAL HIGH (ref 4.0–15.2)

## 2019-08-06 LAB — LUTEINIZING HORMONE: LH: 3.4 m[IU]/mL (ref 1.7–8.6)

## 2019-08-07 ENCOUNTER — Other Ambulatory Visit: Payer: Self-pay | Admitting: Urology

## 2019-08-07 ENCOUNTER — Telehealth: Payer: Self-pay | Admitting: *Deleted

## 2019-08-07 MED ORDER — TESTOSTERONE 20.25 MG/ACT (1.62%) TD GEL: TRANSDERMAL | 1 refills | Status: DC

## 2019-08-07 NOTE — Telephone Encounter (Signed)
Notified patient as instructed, patient pleased. Discussed follow-up appointments, patient agrees  

## 2019-08-07 NOTE — Telephone Encounter (Signed)
-----   Message from Abbie Sons, MD sent at 08/07/2019  9:01 AM EST ----- Repeat testosterone level low at 130.  Pituitary hormones showed no significant abnormalities.  He indicated he was interested in starting testosterone gel at her last visit.  I sent an Rx to his pharmacy.  He will need a 6-week follow-up appointment with testosterone after he starts the medication however that may require prior authorization.

## 2019-08-09 ENCOUNTER — Telehealth: Payer: Self-pay | Admitting: *Deleted

## 2019-08-09 NOTE — Telephone Encounter (Signed)
Request Reference Number: [id number]. Now - 02/06/2020

## 2019-08-09 NOTE — Telephone Encounter (Signed)
Request Reference Number: [id number]. T

## 2019-09-13 ENCOUNTER — Ambulatory Visit: Payer: 59 | Attending: Internal Medicine

## 2019-09-13 DIAGNOSIS — Z23 Encounter for immunization: Secondary | ICD-10-CM

## 2019-09-13 NOTE — Progress Notes (Signed)
   Covid-19 Vaccination Clinic  Name:  CICERO WICKES    MRN: [id number] DOB: 10/30/1970  09/13/2019  Mr. Swisher was observed post Covid-19 immunization for 15 minutes without incident. He was provided with Vaccine Information Sheet and instruction to access the V-Safe system.   Mr. Weisner was instructed to call 911 with any severe reactions post vaccine: Marland Kitchen Difficulty breathing  . Swelling of face and throat  . A fast heartbeat  . A bad rash all over body  . Dizziness and weakness   Immunizations Administered    Name Date Dose VIS Date Route   Pfizer COVID-19 Vaccine 09/13/2019  9:29 AM 0.3 mL 06/07/2019 Intramuscular   Manufacturer: [place]   Lot: [id number]   [place]: [id number]

## 2019-09-24 ENCOUNTER — Other Ambulatory Visit: Payer: Self-pay | Admitting: Family Medicine

## 2019-09-24 DIAGNOSIS — R7989 Other specified abnormal findings of blood chemistry: Secondary | ICD-10-CM

## 2019-09-25 ENCOUNTER — Other Ambulatory Visit: Payer: Self-pay

## 2019-09-25 ENCOUNTER — Other Ambulatory Visit: Payer: 59

## 2019-09-25 DIAGNOSIS — R7989 Other specified abnormal findings of blood chemistry: Secondary | ICD-10-CM

## 2019-09-26 LAB — TESTOSTERONE: Testosterone: 298 ng/dL (ref 264–916)

## 2019-09-29 NOTE — Progress Notes (Signed)
09/30/19 12:30 PM   Timothy Delacruz 08-Nov-1970 [id number]  Referring provider: Pleas Koch, NP [place],  [place] [address]  Chief Complaint  Patient presents with  . Follow-up    HPI: 49 y.o. male returns today for a 6 week f/u for evaluation and management of hypogonadism.   -last visit noted some difficulty with maintaining an erection, decreased libido and fatigue/tiredness -hx testosterone supplement use w/o relief  -currently using testosterone gel daily w/ symptom improvement  -recent testosterone 08/2019, 298 ng/dL  -PSA 0.47 -FHx of prostate cancer   Testosterone levels:  2015, 225 ng/dL  06/2019, 151 ng/dL  07/2019, 130 ng/dL 08/2019, 298 ng/dL   PMH: Past Medical History:  Diagnosis Date  . Hyperlipidemia   . Hypertension   . Loss or death of partner 01-Apr-2013    Surgical History: Past Surgical History:  Procedure Laterality Date  . COLONOSCOPY WITH PROPOFOL N/A 07/19/2019   Procedure: COLONOSCOPY WITH PROPOFOL;  Surgeon: Jonathon Bellows, MD;  Location: [facility] ENDOSCOPY;  Service: Gastroenterology;  Laterality: N/A;  . LAPAROSCOPIC GASTRIC BANDING  Apr 02, 2011    Home Medications:  Allergies as of 09/30/2019      Reactions   Lisinopril    REACTION: cough      Medication List       Accurate as of September 30, 2019 12:30 PM. If you have any questions, ask your nurse or doctor.        calcium citrate-vitamin D 200-200 MG-UNIT Tabs Take 4 tablets by mouth daily.   hydrochlorothiazide 12.5 MG tablet Commonly known as: HYDRODIURIL Take 1 tablet (12.5 mg total) by mouth daily. For blood pressure.   multivitamin with minerals tablet Take 1 tablet by mouth daily.   Testosterone 20.25 MG/ACT (1.62%) Gel Apply 1 pump to each shoulder daily       Allergies:  Allergies  Allergen Reactions  . Lisinopril     REACTION: cough    Family History: Family History  Problem Relation Age of Onset  . Cancer Father        prostate  . Heart  attack Paternal Grandmother   . Cancer Paternal Grandfather        Cancer  . Cancer Paternal Aunt        lung    Social History:  reports that he has never smoked. He has never used smokeless tobacco. He reports current alcohol use. He reports that he does not use drugs.   Physical Exam: BP (!) 143/84   Pulse 90   Ht 5\' 11"  (1.803 m)   Wt (!) 440 lb (199.6 kg)   BMI 61.37 kg/m   Constitutional:  Alert and oriented, No acute distress. HEENT: [place] AT, moist mucus membranes.  Trachea midline, no masses. Cardiovascular: No clubbing, cyanosis, or edema. Respiratory: Normal respiratory effort, no increased work of breathing. Skin: No rashes, bruises or suspicious lesions. Neurologic: Grossly intact, no focal deficits, moving all 4 extremities. Psychiatric: Normal mood and affect.  Laboratory Data:  Lab Results  Component Value Date   CREATININE 0.72 07/01/2019    Lab Results  Component Value Date   PSA 0.47 07/01/2019   PSA 1.05 2013-04-01   PSA 0.59 08/04/2010    Lab Results  Component Value Date   TESTOSTERONE 298 09/25/2019    Assessment & Plan:    1. Hypogonadism  Recent testosterone 08/2019, 298 ng/dL  Symptomatic improvement though level low normal.  He is applying on the hairbearing areas of his  shoulder and will have him apply on bicep and plantar surface of forearm Return for lab visit testosterone in 1 month and then in 4 months for monitoring  Will call with results    Abbie Sons, [place] [address], [place], [place] [address] [phone]  I, Lucas Mallow, am acting as a scribe for Dr. Nicki Reaper C. Kmya Placide,  I have reviewed the above documentation for accuracy and completeness, and I agree with the above.   Abbie Sons, MD

## 2019-09-30 ENCOUNTER — Ambulatory Visit (INDEPENDENT_AMBULATORY_CARE_PROVIDER_SITE_OTHER): Payer: 59 | Admitting: Urology

## 2019-09-30 ENCOUNTER — Other Ambulatory Visit: Payer: Self-pay

## 2019-09-30 ENCOUNTER — Encounter: Payer: Self-pay | Admitting: Urology

## 2019-09-30 VITALS — BP 143/84 | HR 90 | Ht 71.0 in | Wt >= 6400 oz

## 2019-09-30 DIAGNOSIS — E291 Testicular hypofunction: Secondary | ICD-10-CM | POA: Diagnosis not present

## 2019-10-08 ENCOUNTER — Other Ambulatory Visit: Payer: Self-pay | Admitting: Urology

## 2019-10-08 ENCOUNTER — Ambulatory Visit: Payer: 59 | Attending: Internal Medicine

## 2019-10-08 DIAGNOSIS — Z23 Encounter for immunization: Secondary | ICD-10-CM

## 2019-10-08 NOTE — Progress Notes (Signed)
   Covid-19 Vaccination Clinic  Name:  Timothy Delacruz    MRN: [id number] DOB: 02-03-1971  10/08/2019  [name] was observed post Covid-19 immunization for 15 minutes without incident. He was provided with Vaccine Information Sheet and instruction to access the V-Safe system.   [name] was instructed to call 911 with any severe reactions post vaccine: Marland Kitchen Difficulty breathing  . Swelling of face and throat  . A fast heartbeat  . A bad rash all over body  . Dizziness and weakness   Immunizations Administered    Name Date Dose VIS Date Route   Pfizer COVID-19 Vaccine 10/08/2019 10:15 AM 0.3 mL 06/07/2019 Intramuscular   Manufacturer: [place]   Lot: [id number]   [place]: [id number]

## 2019-10-22 ENCOUNTER — Encounter: Payer: Self-pay | Admitting: Primary Care

## 2019-10-22 ENCOUNTER — Ambulatory Visit (INDEPENDENT_AMBULATORY_CARE_PROVIDER_SITE_OTHER): Payer: 59 | Admitting: Primary Care

## 2019-10-22 ENCOUNTER — Ambulatory Visit (INDEPENDENT_AMBULATORY_CARE_PROVIDER_SITE_OTHER)
Admission: RE | Admit: 2019-10-22 | Discharge: 2019-10-22 | Disposition: A | Discharge status: Home / Self Care | Payer: 59 | Source: Ambulatory Visit | Attending: Primary Care | Admitting: Primary Care

## 2019-10-22 ENCOUNTER — Other Ambulatory Visit: Payer: Self-pay

## 2019-10-22 DIAGNOSIS — M1711 Unilateral primary osteoarthritis, right knee: Secondary | ICD-10-CM | POA: Insufficient documentation

## 2019-10-22 DIAGNOSIS — M25569 Pain in unspecified knee: Secondary | ICD-10-CM

## 2019-10-22 DIAGNOSIS — M25561 Pain in right knee: Secondary | ICD-10-CM

## 2019-10-22 HISTORY — DX: Pain in unspecified knee: M25.569

## 2019-10-22 MED ORDER — MELOXICAM 15 MG PO TABS: 15.0000 mg | ORAL_TABLET | Freq: Every day | ORAL | 0 refills | Status: DC | PRN

## 2019-10-22 NOTE — Patient Instructions (Addendum)
You may take Meloxicam once daily as needed for knee pain. Avoid taking Ibuprofen, Aleve, naproxen while taking Meloxicam. You may take acetaminophen.   Try the topical Voltaren Gel as discussed.  Complete xray(s) prior to leaving today. I will notify you of your results once received.  Work on [employer] for weight loss and for knee pain.   Please update me in a week.  It was a pleasure to see you today!

## 2019-10-22 NOTE — Progress Notes (Signed)
Subjective:    Patient ID: Timothy Delacruz, male    DOB: 1970/11/26, 49 y.o.   MRN: [id number]  HPI  This visit occurred during the SARS-CoV-2 public health emergency.  Safety protocols were in place, including screening questions prior to the visit, additional usage of staff PPE, and extensive cleaning of exam room while observing appropriate contact time as indicated for disinfecting solutions.   Timothy Delacruz is a 49 year old male with a history of PVD, hypertension, hyperlipidemia, low testosterone, spinal stenosis who presents today with a chief complaint of knee pain.  His pain is located to the right anterior knee and posterior calf for which he describes as a "cramp". His symptoms began a few weeks ago. He's applied ice packs, icy hot, ibuprofen, acetaminophen without much improvement.   He will notice pain with flexion of his knee, improved with extension. He has a strong family history of arthritis and his mother has had bilateral knee replacements.   He denies injury/trauma, knee swelling, prolonged standing or walking, calf swelling or erythema, left knee pain. He did go on vacation several days after his symptoms began. At times he does notice some instability to the right knee. During his vacation he was slightly more active than usual.    Review of Systems  Musculoskeletal: Positive for arthralgias. Negative for joint swelling.  Skin: Negative for color change.       Past Medical History:  Diagnosis Date  . Hyperlipidemia   . Hypertension   . Loss or death of partner 04/06/13     Social History   Socioeconomic History  . Marital status: Single    Spouse name: Not on file  . Number of children: Not on file  . Years of education: Not on file  . Highest education level: Not on file  Occupational History  . Not on file  Tobacco Use  . Smoking status: Never Smoker  . Smokeless tobacco: Never Used  Substance and Sexual Activity  . Alcohol use: Yes    Comment:  occ  . Drug use: No  . Sexual activity: Not on file  Other Topics Concern  . Not on file  Social History Narrative  . Not on file   Social Determinants of Health   Financial Resource Strain:   . Difficulty of Paying Living Expenses:   Food Insecurity:   . Worried About Charity fundraiser in the Last Year:   . Arboriculturist in the Last Year:   Transportation Needs:   . Film/video editor (Medical):   Marland Kitchen Lack of Transportation (Non-Medical):   Physical Activity:   . Days of Exercise per Week:   . Minutes of Exercise per Session:   Stress:   . Feeling of Stress :   Social Connections:   . Frequency of Communication with Friends and Family:   . Frequency of Social Gatherings with Friends and Family:   . Attends Religious Services:   . Active Member of Clubs or Organizations:   . Attends Archivist Meetings:   Marland Kitchen Marital Status:   Intimate Partner Violence:   . Fear of Current or Ex-Partner:   . Emotionally Abused:   Marland Kitchen Physically Abused:   . Sexually Abused:     Past Surgical History:  Procedure Laterality Date  . COLONOSCOPY WITH PROPOFOL N/A 07/19/2019   Procedure: COLONOSCOPY WITH PROPOFOL;  Surgeon: Jonathon Bellows, MD;  Location: [facility] ENDOSCOPY;  Service: Gastroenterology;  Laterality: N/A;  . LAPAROSCOPIC  GASTRIC BANDING  02/2011    Family History  Problem Relation Age of Onset  . Cancer Father        prostate  . Heart attack Paternal Grandmother   . Cancer Paternal Grandfather        Cancer  . Cancer Paternal Aunt        lung    Allergies  Allergen Reactions  . Lisinopril     REACTION: cough    Current Outpatient Medications on File Prior to Visit  Medication Sig Dispense Refill  . calcium citrate-vitamin D 200-200 MG-UNIT TABS Take 4 tablets by mouth daily.      . hydrochlorothiazide (HYDRODIURIL) 12.5 MG tablet Take 1 tablet (12.5 mg total) by mouth daily. For blood pressure. 90 tablet 3  . Multiple Vitamins-Minerals (MULTIVITAMIN WITH  MINERALS) tablet Take 1 tablet by mouth daily.      . Testosterone 20.25 MG/ACT (1.62%) GEL APPLY 1 PUMP TO EACH SHOULDER DAILY 75 g 1   No current facility-administered medications on file prior to visit.    BP 140/80   Pulse 86   Temp (!) 96 F (35.6 C) (Temporal)   Ht 5\' 11"  (1.803 m)   Wt (!) 446 lb 12 oz (202.6 kg)   SpO2 98%   BMI 62.31 kg/m    Objective:   Physical Exam  Constitutional: He appears well-nourished.  Musculoskeletal:     Right knee: No swelling or bony tenderness. Normal range of motion. No tenderness.       Legs:     Comments: No laxity to knee joint. No erythema, warmth, effusion. 5/5 strength bilaterally to lower extremities.   Skin: Skin is warm and dry. No erythema.           Assessment & Plan:

## 2019-10-22 NOTE — Assessment & Plan Note (Signed)
Acute for the last week, no trauma/injury. Exam today without obvious deformity or effusion.  Suspect arthritis, especially given body habitus. Encouraged weight loss, he does plan on starting back up at the [facility] for water aerobics.  Check plain films today. Rx for Meloxicam sent to pharmacy. Discussed use of Voltaren Gel.  Consider PT vs sports medicine referral, he will update.

## 2019-10-28 ENCOUNTER — Ambulatory Visit (INDEPENDENT_AMBULATORY_CARE_PROVIDER_SITE_OTHER): Payer: 59 | Admitting: Family Medicine

## 2019-10-28 ENCOUNTER — Encounter: Payer: Self-pay | Admitting: Family Medicine

## 2019-10-28 ENCOUNTER — Other Ambulatory Visit: Payer: Self-pay

## 2019-10-28 VITALS — BP 130/90 | HR 77 | Temp 98.0°F | Ht 71.0 in | Wt >= 6400 oz

## 2019-10-28 DIAGNOSIS — Z6841 Body Mass Index (BMI) 40.0 and over, adult: Secondary | ICD-10-CM | POA: Diagnosis not present

## 2019-10-28 DIAGNOSIS — M25561 Pain in right knee: Secondary | ICD-10-CM

## 2019-10-28 NOTE — Progress Notes (Signed)
Timothy Delacruz T. Syncere Kaminski, MD, [name]  Primary Care and [place] at [facility] [place], [address]  Phone: [phone]  FAX: [phone]  [name] - 49 y.o. male  MRN [id number]  Date of Birth: [birth year]  Date: 10/28/2019  PCP: Pleas Koch, NP  Referral: Pleas Koch, NP  Chief Complaint  Patient presents with  . Knee Pain    Right x 2 weeks    This visit occurred during the SARS-CoV-2 public health emergency.  Safety protocols were in place, including screening questions prior to the visit, additional usage of staff PPE, and extensive cleaning of exam room while observing appropriate contact time as indicated for disinfecting solutions.   Subjective:   Timothy Delacruz is a 49 y.o. very pleasant male patient with Body mass index is 62.38 kg/m. who presents with the following:  About two weeks ago, started to have some pressure to walk on it.  Maybe in his sleep, but no injury that he can recall.  Tried some otc and cold.  Nothing has made it get better.  Week of the eleventh has gotten worse.  Friday went out and did his grocery shopping 10 days ago.  Tried to stay off it.   At this point since the end of last week his knee has steadily improved and he thinks it is about 90% better at this point.  He is here for additional evaluation and recommendations.  Likely men tear, deg medial (post)  Review of Systems is noted in the HPI, as appropriate   Objective:   BP 130/90   Pulse 77   Temp 98 F (36.7 C) (Temporal)   Ht 5\' 11"  (1.803 m)   Wt (!) 447 lb 4 oz (202.9 kg)   SpO2 97%   BMI 62.38 kg/m   GEN: No acute distress; alert,appropriate. PULM: Breathing comfortably in no respiratory distress PSYCH: Normally interactive.    Right knee: Full extension, flexion to 120 degrees.  Nontender with loading the medial lateral patella facets.  Does have some tenderness at the posterior  medial joint line.  Stable to varus and valgus stress.  ACL and PCL are intact.  Deep flexion and bounce home do not cause tenderness.  McMurray's is negative.  All tendons and ligaments are intact.  Radiology: DG Knee 4 Views W/Patella Right  Result Date: 10/22/2019 CLINICAL DATA:  Acute right knee pain. No known trauma. EXAM: RIGHT KNEE - COMPLETE 4+ VIEW COMPARISON:  AP view of the left knee with weight-bearing for comparison dated 10/22/2019 FINDINGS: There is no fracture or dislocation or appreciable joint effusion. There is slight medial joint space narrowing. Soft tissue calcifications adjacent to the medial femoral condyle could represent remote injury to the medial collateral ligament. Small marginal osteophytes on the patella. The AP view of the left knee demonstrates slight lateral joint space narrowing and similar but less prominent calcifications adjacent to the medial femoral condyle. IMPRESSION: No acute abnormality. Slight arthritic changes of the right knee. Soft tissue calcifications adjacent to the medial femoral condyle could represent remote injury to the medial collateral ligament. Electronically Signed   By: Lorriane Shire M.D.   On: 10/22/2019 16:29    Assessment and Plan:     ICD-10-CM   1. Acute pain of right knee  M25.561   2. Morbid obesity with BMI of 60.0-69.9, adult ([place])  E66.01    Z68.44    He really  is doing mostly better, I do not think he really needs to do anything additional.  Acute meniscal contusion possible a mild arthritis flare.  Biggest issue is his weight, and we discussed this and he understands that he does need to lose weight.  He is already doing some Voltaren gel.  I appreciate the opportunity to evaluate this very friendly patient. If you have any question regarding his care or prognosis, do not hesitate to ask.   Follow-up: No follow-ups on file.  No orders of the defined types were placed in this encounter.  There are no discontinued  medications. No orders of the defined types were placed in this encounter.   Signed,  Maud Deed. Jordyne Poehlman, MD   Outpatient Encounter Medications as of 10/28/2019  Medication Sig  . calcium citrate-vitamin D 200-200 MG-UNIT TABS Take 4 tablets by mouth daily.    . diclofenac Sodium (VOLTAREN) 1 % GEL Apply 2 g topically 4 (four) times daily.  . hydrochlorothiazide (HYDRODIURIL) 12.5 MG tablet Take 1 tablet (12.5 mg total) by mouth daily. For blood pressure.  . meloxicam (MOBIC) 15 MG tablet Take 1 tablet (15 mg total) by mouth daily as needed for pain.  . Multiple Vitamins-Minerals (MULTIVITAMIN WITH MINERALS) tablet Take 1 tablet by mouth daily.    . Testosterone 20.25 MG/ACT (1.62%) GEL APPLY 1 PUMP TO EACH SHOULDER DAILY   No facility-administered encounter medications on file as of 10/28/2019.

## 2019-10-29 ENCOUNTER — Other Ambulatory Visit: Payer: Self-pay

## 2019-10-29 DIAGNOSIS — R7989 Other specified abnormal findings of blood chemistry: Secondary | ICD-10-CM

## 2019-10-30 ENCOUNTER — Other Ambulatory Visit: Payer: 59

## 2019-10-30 ENCOUNTER — Other Ambulatory Visit: Payer: Self-pay

## 2019-11-04 ENCOUNTER — Other Ambulatory Visit: Payer: Self-pay | Admitting: *Deleted

## 2019-11-04 DIAGNOSIS — E291 Testicular hypofunction: Secondary | ICD-10-CM

## 2019-11-05 ENCOUNTER — Other Ambulatory Visit: Payer: Self-pay

## 2019-11-05 ENCOUNTER — Other Ambulatory Visit: Payer: 59

## 2019-11-05 DIAGNOSIS — E291 Testicular hypofunction: Secondary | ICD-10-CM

## 2019-11-06 ENCOUNTER — Telehealth: Payer: Self-pay | Admitting: Urology

## 2019-11-06 DIAGNOSIS — R7989 Other specified abnormal findings of blood chemistry: Secondary | ICD-10-CM

## 2019-11-06 LAB — TESTOSTERONE: Testosterone: 1188 ng/dL — ABNORMAL HIGH (ref 264–916)

## 2019-11-06 NOTE — Telephone Encounter (Signed)
Per pt it wasn't working on his shoulders because of the hair. He started doing it on the inside of forearm, one pump each arm.

## 2019-11-06 NOTE — Telephone Encounter (Signed)
Testosterone elevated at 1188.  How much gel is he applying and where is he applying?

## 2019-11-07 MED ORDER — TESTOSTERONE 20.25 MG/ACT (1.62%) TD GEL: TRANSDERMAL | 1 refills | Status: DC

## 2019-11-07 NOTE — Telephone Encounter (Signed)
Spoke with patient and advised results Lab appt scheduled 12/09/2019

## 2019-11-07 NOTE — Telephone Encounter (Signed)
Based on his elevated level would recommend changing to 2 pumps daily on Monday, Wednesday, Friday, Sunday and 1 pump daily on Tuesday, Thursday, Saturday.  Lab visit for repeat testosterone level in 1 month

## 2019-11-14 ENCOUNTER — Other Ambulatory Visit: Payer: Self-pay | Admitting: Primary Care

## 2019-11-14 DIAGNOSIS — M25561 Pain in right knee: Secondary | ICD-10-CM

## 2019-11-14 NOTE — Telephone Encounter (Signed)
Message left for patient to return my call.  

## 2019-12-09 ENCOUNTER — Other Ambulatory Visit: Payer: Self-pay

## 2019-12-09 ENCOUNTER — Other Ambulatory Visit: Payer: 59

## 2019-12-10 ENCOUNTER — Other Ambulatory Visit: Payer: 59

## 2019-12-11 ENCOUNTER — Telehealth: Payer: Self-pay | Admitting: Urology

## 2019-12-11 LAB — TESTOSTERONE: Testosterone: 1371 ng/dL — ABNORMAL HIGH (ref 264–916)

## 2019-12-11 NOTE — Telephone Encounter (Signed)
Testosterone level remains elevated at 1371.  We had discussed changing dose last visit.  Please review how much he is applying and how often.

## 2019-12-12 NOTE — Telephone Encounter (Signed)
Tuesday, thursday and Saturday one pump , two pumps on Monday , Wednesday , Friday , Sunday

## 2019-12-13 ENCOUNTER — Other Ambulatory Visit: Payer: Self-pay | Admitting: Urology

## 2019-12-15 NOTE — Telephone Encounter (Signed)
Have him change to one pump Monday through Friday and two pumps Saturday/Sunday.  Repeat T level 4 weeks

## 2019-12-16 NOTE — Telephone Encounter (Signed)
Notified patient as instructed, patient pleased. Discussed follow-up appointments, patient agrees  

## 2020-01-20 ENCOUNTER — Other Ambulatory Visit: Payer: Self-pay

## 2020-01-20 DIAGNOSIS — R7989 Other specified abnormal findings of blood chemistry: Secondary | ICD-10-CM

## 2020-01-20 NOTE — Progress Notes (Signed)
TESTO

## 2020-01-21 ENCOUNTER — Other Ambulatory Visit: Payer: Self-pay | Admitting: *Deleted

## 2020-01-21 ENCOUNTER — Other Ambulatory Visit: Payer: 59

## 2020-01-21 DIAGNOSIS — R7989 Other specified abnormal findings of blood chemistry: Secondary | ICD-10-CM

## 2020-01-21 DIAGNOSIS — E291 Testicular hypofunction: Secondary | ICD-10-CM

## 2020-01-22 LAB — TESTOSTERONE: Testosterone: 650 ng/dL (ref 264–916)

## 2020-01-27 ENCOUNTER — Other Ambulatory Visit: Payer: 59

## 2020-01-27 ENCOUNTER — Other Ambulatory Visit: Payer: Self-pay

## 2020-01-27 DIAGNOSIS — R7989 Other specified abnormal findings of blood chemistry: Secondary | ICD-10-CM

## 2020-01-27 DIAGNOSIS — E291 Testicular hypofunction: Secondary | ICD-10-CM

## 2020-01-28 LAB — HEMATOCRIT: Hematocrit: 51.2 % — ABNORMAL HIGH (ref 37.5–51.0)

## 2020-01-28 LAB — PSA: Prostate Specific Ag, Serum: 1.5 ng/mL (ref 0.0–4.0)

## 2020-01-28 LAB — TESTOSTERONE: Testosterone: >1500 ng/dL — ABNORMAL HIGH (ref 264–916)

## 2020-01-29 NOTE — Progress Notes (Signed)
error 

## 2020-01-30 ENCOUNTER — Encounter: Payer: Self-pay | Admitting: Urology

## 2020-01-30 ENCOUNTER — Other Ambulatory Visit: Payer: Self-pay

## 2020-01-30 ENCOUNTER — Ambulatory Visit (INDEPENDENT_AMBULATORY_CARE_PROVIDER_SITE_OTHER): Payer: 59 | Admitting: Urology

## 2020-01-30 VITALS — BP 171/90 | HR 101 | Ht 71.0 in | Wt >= 6400 oz

## 2020-01-30 DIAGNOSIS — E291 Testicular hypofunction: Secondary | ICD-10-CM | POA: Diagnosis not present

## 2020-01-30 NOTE — Progress Notes (Signed)
01/30/2020 1:37 PM   Timothy Delacruz [birth year] [id number]  Referring provider: Pleas Koch, NP [place],  [place] [address]  Chief Complaint  Patient presents with   Hypogonadism    Urologic history: 1.  Hypogonadism -Testosterone gel started 08/2019   HPI: 49 y.o. male presents for follow-up of hypogonadism.   Symptoms decreased libido, tiredness, fatigue and ED  Initial T level after 1 month of gel was 298, he was applying on hairbearing areas which may have been affecting absorption and switched to nonhair bearing areas of the upper extremity  Subsequent testosterone levels have been elevated >1000 and dose has been slowly lowered  Currently applying 1 pump daily Monday through Friday and 2 pumps Saturday Sunday  Applying on the plantar surface of the forearms  Most recent testosterone level >1500  Does not apply gel the morning before T level drawn  Has noted moderate improvement in symptoms    PMH: Past Medical History:  Diagnosis Date   Hyperlipidemia    Hypertension    Loss or death of partner 04/19/13    Surgical History: Past Surgical History:  Procedure Laterality Date   COLONOSCOPY WITH PROPOFOL N/A 07/19/2019   Procedure: COLONOSCOPY WITH PROPOFOL;  Surgeon: Jonathon Bellows, MD;  Location: [facility] ENDOSCOPY;  Service: Gastroenterology;  Laterality: N/A;   LAPAROSCOPIC GASTRIC BANDING  Apr 20, 2011    Home Medications:  Allergies as of 01/30/2020      Reactions   Lisinopril    REACTION: cough      Medication List       Accurate as of January 30, 2020  1:37 PM. If you have any questions, ask your nurse or doctor.        calcium citrate-vitamin D 200-200 MG-UNIT Tabs Take 4 tablets by mouth daily.   hydrochlorothiazide 12.5 MG tablet Commonly known as: HYDRODIURIL Take 1 tablet (12.5 mg total) by mouth daily. For blood pressure.   meloxicam 15 MG tablet Commonly known as: MOBIC Take 1 tablet (15 mg total) by mouth  daily as needed for pain.   multivitamin with minerals tablet Take 1 tablet by mouth daily.   Testosterone 20.25 MG/ACT (1.62%) Gel Apply 2 pumps daily Monday, Wednesday, Friday, Sunday and 1 pump daily Tuesday, Thursday, Saturday   Voltaren 1 % Gel Generic drug: diclofenac Sodium Apply 2 g topically 4 (four) times daily.       Allergies:  Allergies  Allergen Reactions   Lisinopril     REACTION: cough    Family History: Family History  Problem Relation Age of Onset   Cancer Father        prostate   Heart attack Paternal Grandmother    Cancer Paternal Grandfather        Cancer   Cancer Paternal Aunt        lung    Social History:  reports that he has never smoked. He has never used smokeless tobacco. He reports current alcohol use. He reports that he does not use drugs.   Physical Exam: BP (!) 171/90    Pulse (!) 101    Ht 5\' 11"  (1.803 m)    Wt (!) 421 lb (191 kg)    BMI 58.72 kg/m   Constitutional:  Alert and oriented, No acute distress. HEENT: [place] AT, moist mucus membranes.  Trachea midline, no masses. Cardiovascular: No clubbing, cyanosis, or edema. Respiratory: Normal respiratory effort, no increased work of breathing. GI: Abdomen is soft, nontender, nondistended, no abdominal masses  GU: Prostate 35 g, smooth without nodules Skin: No rashes, bruises or suspicious lesions. Neurologic: Grossly intact, no focal deficits, moving all 4 extremities. Psychiatric: Normal mood and affect.   Assessment & Plan:    1.  Hypogonadism  Testosterone levels have been persistently elevated with the exception of level on 7/27  We will have him decrease to 1 pump every other day and repeat testosterone level 1 month  Will titrate up as needed  2.  Increased PSA  Most recent PSA was 1.5 which is increased above baseline with prior PSA 06/2019 0.47.  Will recheck once his testosterone level stabilizes.  DRE was benign  3.  Mild erythrocytosis  Recheck once  testosterone level stabilized    Abbie Sons, MD  [facility] [address], [place], [place] [address] [phone]

## 2020-03-04 ENCOUNTER — Other Ambulatory Visit: Payer: Self-pay | Admitting: Family Medicine

## 2020-03-04 DIAGNOSIS — E291 Testicular hypofunction: Secondary | ICD-10-CM

## 2020-03-05 ENCOUNTER — Other Ambulatory Visit: Payer: Self-pay

## 2020-03-05 ENCOUNTER — Other Ambulatory Visit: Payer: 59

## 2020-03-09 ENCOUNTER — Other Ambulatory Visit: Payer: 59

## 2020-03-09 ENCOUNTER — Other Ambulatory Visit: Payer: Self-pay

## 2020-03-09 DIAGNOSIS — E291 Testicular hypofunction: Secondary | ICD-10-CM

## 2020-03-10 ENCOUNTER — Telehealth: Payer: Self-pay | Admitting: *Deleted

## 2020-03-10 LAB — TESTOSTERONE: Testosterone: 971 ng/dL — ABNORMAL HIGH (ref 264–916)

## 2020-03-10 NOTE — Telephone Encounter (Signed)
-----   Message from Abbie Sons, MD sent at 03/10/2020  1:01 PM EDT ----- Testosterone level better but slightly above baseline at 971.  Recommend office visit 2 months with PSA, testosterone, hematocrit prior

## 2020-03-10 NOTE — Telephone Encounter (Signed)
Notified patient as instructed, patient pleased. Discussed follow-up appointments, patient agrees  

## 2020-03-16 ENCOUNTER — Other Ambulatory Visit: Payer: Self-pay

## 2020-03-17 MED ORDER — TESTOSTERONE 20.25 MG/ACT (1.62%) TD GEL
TRANSDERMAL | 1 refills | Status: DC
Start: 2020-03-17 — End: 2021-07-02

## 2020-05-11 ENCOUNTER — Other Ambulatory Visit: Payer: Self-pay

## 2020-05-11 DIAGNOSIS — E291 Testicular hypofunction: Secondary | ICD-10-CM

## 2020-05-11 DIAGNOSIS — R7989 Other specified abnormal findings of blood chemistry: Secondary | ICD-10-CM

## 2020-05-12 ENCOUNTER — Other Ambulatory Visit: Payer: Self-pay

## 2020-05-12 ENCOUNTER — Other Ambulatory Visit: Payer: 59

## 2020-05-13 ENCOUNTER — Other Ambulatory Visit: Payer: 59

## 2020-05-14 ENCOUNTER — Ambulatory Visit (INDEPENDENT_AMBULATORY_CARE_PROVIDER_SITE_OTHER): Payer: 59 | Admitting: Urology

## 2020-05-14 ENCOUNTER — Encounter: Payer: Self-pay | Admitting: Urology

## 2020-05-14 ENCOUNTER — Other Ambulatory Visit: Payer: Self-pay

## 2020-05-14 VITALS — BP 153/88 | HR 90 | Ht 71.0 in | Wt >= 6400 oz

## 2020-05-14 DIAGNOSIS — E291 Testicular hypofunction: Secondary | ICD-10-CM

## 2020-05-14 LAB — HEMATOCRIT: Hematocrit: 45.6 % (ref 37.5–51.0)

## 2020-05-14 LAB — PSA: Prostate Specific Ag, Serum: 1 ng/mL (ref 0.0–4.0)

## 2020-05-14 LAB — TESTOSTERONE: Testosterone: >1500 ng/dL — ABNORMAL HIGH (ref 264–916)

## 2020-05-14 NOTE — Progress Notes (Signed)
   05/14/2020 2:28 PM   Timothy Delacruz 09-May-1971 [id number]  Referring provider: Pleas Koch, NP [place],  [place] [address]  Chief Complaint  Patient presents with  . Follow-up    HPI: 49 y.o. male presents for follow-up of hypogonadism.   Primary symptoms tiredness, fatigue, decreased libido, mild ED  On testosterone gel 1.62% with elevated levels in the 1000 range  Currently applying 1 pump every other day  Denies bothersome LUTS, breast tenderness/enlargement or lower extremity edema  Symptoms have significantly improved  Labs 05/13/2020: Testosterone >1500, PSA 1.0, HCT 45.6   PMH: Past Medical History:  Diagnosis Date  . Hyperlipidemia   . Hypertension   . Loss or death of partner 2013-04-04    Surgical History: Past Surgical History:  Procedure Laterality Date  . COLONOSCOPY WITH PROPOFOL N/A 07/19/2019   Procedure: COLONOSCOPY WITH PROPOFOL;  Surgeon: Jonathon Bellows, MD;  Location[facility] ENDOSCOPY;  Service: Gastroenterology;  Laterality: N/A;  . LAPAROSCOPIC GASTRIC BANDING  02/2011    Home Medications:  Allergies as of 05/14/2020      Reactions   Lisinopril    REACTION: cough      Medication List       Accurate as of May 14, 2020  2:28 PM. If you have any questions, ask your nurse or doctor.        calcium citrate-vitamin D 200-200 MG-UNIT Tabs Take 4 tablets by mouth daily.   hydrochlorothiazide 12.5 MG tablet Commonly known as: HYDRODIURIL Take 1 tablet (12.5 mg total) by mouth daily. For blood pressure.   meloxicam 15 MG tablet Commonly known as: MOBIC Take 1 tablet (15 mg total) by mouth daily as needed for pain.   multivitamin with minerals tablet Take 1 tablet by mouth daily.   Testosterone 20.25 MG/ACT (1.62%) Gel Apply 2 pumps daily Monday, Wednesday, Friday, Sunday and 1 pump daily Tuesday, Thursday, Saturday What changed: additional instructions   Voltaren 1 % Gel Generic drug: diclofenac  Sodium Apply 2 g topically 4 (four) times daily.       Allergies:  Allergies  Allergen Reactions  . Lisinopril     REACTION: cough    Family History: Family History  Problem Relation Age of Onset  . Cancer Father        prostate  . Heart attack Paternal Grandmother   . Cancer Paternal Grandfather        Cancer  . Cancer Paternal Aunt        lung    Social History:  reports that he has never smoked. He has never used smokeless tobacco. He reports current alcohol use. He reports that he does not use drugs.   Physical Exam: BP (!) 153/88   Pulse 90   Constitutional:  Alert and oriented, No acute distress. HEENT: [place] AT, moist mucus membranes.  Trachea midline, no masses. Cardiovascular: No clubbing, cyanosis, or edema. Respiratory: Normal respiratory effort, no increased work of breathing. Skin: No rashes, bruises or suspicious lesions. Neurologic: Grossly intact, no focal deficits, moving all 4 extremities. Psychiatric: Normal mood and affect.   Assessment & Plan:    1.  Hypogonadism  Significant testosterone elevation on minimal gel replacement  Recommend discontinuing testosterone completely  Lab visit for repeat level 8 weeks   Abbie Sons, MD  [facility] [address] [place], [place] [address] [phone]

## 2020-05-15 ENCOUNTER — Ambulatory Visit: Payer: Self-pay | Admitting: Urology

## 2020-05-20 ENCOUNTER — Other Ambulatory Visit: Payer: Self-pay | Admitting: Urology

## 2020-06-23 ENCOUNTER — Other Ambulatory Visit: Payer: Self-pay | Admitting: Primary Care

## 2020-06-23 DIAGNOSIS — I10 Essential (primary) hypertension: Secondary | ICD-10-CM

## 2020-07-09 ENCOUNTER — Other Ambulatory Visit: Payer: Self-pay

## 2020-07-15 ENCOUNTER — Other Ambulatory Visit: Payer: Self-pay | Admitting: *Deleted

## 2020-07-15 DIAGNOSIS — E291 Testicular hypofunction: Secondary | ICD-10-CM

## 2020-07-16 ENCOUNTER — Other Ambulatory Visit: Payer: 59

## 2020-07-21 ENCOUNTER — Other Ambulatory Visit: Payer: 59

## 2020-07-21 ENCOUNTER — Other Ambulatory Visit: Payer: Self-pay

## 2020-07-21 DIAGNOSIS — E291 Testicular hypofunction: Secondary | ICD-10-CM

## 2020-07-22 LAB — TESTOSTERONE: Testosterone: 168 ng/dL — ABNORMAL LOW (ref 264–916)

## 2021-06-26 ENCOUNTER — Other Ambulatory Visit: Payer: Self-pay | Admitting: Primary Care

## 2021-06-26 DIAGNOSIS — I10 Essential (primary) hypertension: Secondary | ICD-10-CM

## 2021-06-28 ENCOUNTER — Other Ambulatory Visit: Payer: Self-pay | Admitting: Primary Care

## 2021-06-28 DIAGNOSIS — I10 Essential (primary) hypertension: Secondary | ICD-10-CM

## 2021-06-28 NOTE — Telephone Encounter (Signed)
This patient hasn't seen me since April 2021, and that was for an acute visit. Looked like another CMA refilled for 1 year supply on 06/23/20 which is not protocol.  Please have him scheduled ASAP for follow up/CPE. Add him in anywhere. This must be done before I can refill and will only allot a short term supply.

## 2021-06-29 NOTE — Telephone Encounter (Signed)
Called patient have made CPE.

## 2021-06-30 DIAGNOSIS — Z23 Encounter for immunization: Secondary | ICD-10-CM

## 2021-07-02 ENCOUNTER — Other Ambulatory Visit: Payer: Self-pay

## 2021-07-02 ENCOUNTER — Ambulatory Visit (INDEPENDENT_AMBULATORY_CARE_PROVIDER_SITE_OTHER): Payer: 59 | Admitting: Primary Care

## 2021-07-02 ENCOUNTER — Encounter: Payer: Self-pay | Admitting: Primary Care

## 2021-07-02 VITALS — BP 148/82 | HR 96 | Temp 97.1°F | Ht 71.0 in | Wt >= 6400 oz

## 2021-07-02 DIAGNOSIS — Z1159 Encounter for screening for other viral diseases: Secondary | ICD-10-CM | POA: Diagnosis not present

## 2021-07-02 DIAGNOSIS — R0989 Other specified symptoms and signs involving the circulatory and respiratory systems: Secondary | ICD-10-CM

## 2021-07-02 DIAGNOSIS — E785 Hyperlipidemia, unspecified: Secondary | ICD-10-CM

## 2021-07-02 DIAGNOSIS — Z114 Encounter for screening for human immunodeficiency virus [HIV]: Secondary | ICD-10-CM | POA: Diagnosis not present

## 2021-07-02 DIAGNOSIS — Z0001 Encounter for general adult medical examination with abnormal findings: Secondary | ICD-10-CM | POA: Diagnosis not present

## 2021-07-02 DIAGNOSIS — I739 Peripheral vascular disease, unspecified: Secondary | ICD-10-CM | POA: Diagnosis not present

## 2021-07-02 DIAGNOSIS — I10 Essential (primary) hypertension: Secondary | ICD-10-CM

## 2021-07-02 DIAGNOSIS — Z23 Encounter for immunization: Secondary | ICD-10-CM

## 2021-07-02 DIAGNOSIS — M48061 Spinal stenosis, lumbar region without neurogenic claudication: Secondary | ICD-10-CM

## 2021-07-02 DIAGNOSIS — E291 Testicular hypofunction: Secondary | ICD-10-CM | POA: Diagnosis not present

## 2021-07-02 LAB — CBC
HCT: 43.2 % (ref 39.0–52.0)
Hemoglobin: 14.3 g/dL (ref 13.0–17.0)
MCHC: 33.1 g/dL (ref 30.0–36.0)
MCV: 88.1 fl (ref 78.0–100.0)
Platelets: 261 10*3/uL (ref 150.0–400.0)
RBC: 4.91 Mil/uL (ref 4.22–5.81)
RDW: 14 % (ref 11.5–15.5)
WBC: 8.3 10*3/uL (ref 4.0–10.5)

## 2021-07-02 LAB — LIPID PANEL
Cholesterol: 186 mg/dL (ref 0–200)
HDL: 38.4 mg/dL — ABNORMAL LOW (ref >39.00)
LDL Cholesterol: 108 mg/dL — ABNORMAL HIGH (ref 0–99)
NonHDL: 147.54
Total CHOL/HDL Ratio: 5
Triglycerides: 197 mg/dL — ABNORMAL HIGH (ref 0.0–149.0)
VLDL: 39.4 mg/dL (ref 0.0–40.0)

## 2021-07-02 LAB — COMPREHENSIVE METABOLIC PANEL
ALT: 22 U/L (ref 0–53)
AST: 20 U/L (ref 0–37)
Albumin: 4 g/dL (ref 3.5–5.2)
Alkaline Phosphatase: 49 U/L (ref 39–117)
BUN: 13 mg/dL (ref 6–23)
CO2: 26 mEq/L (ref 19–32)
Calcium: 9.3 mg/dL (ref 8.4–10.5)
Chloride: 101 mEq/L (ref 96–112)
Creatinine, Ser: 0.74 mg/dL (ref 0.40–1.50)
GFR: 103.81 mL/min (ref >60.00)
Glucose, Bld: 96 mg/dL (ref 70–99)
Potassium: 4.2 mEq/L (ref 3.5–5.1)
Sodium: 135 mEq/L (ref 135–145)
Total Bilirubin: 0.4 mg/dL (ref 0.2–1.2)
Total Protein: 7.3 g/dL (ref 6.0–8.3)

## 2021-07-02 LAB — HEMOGLOBIN A1C: Hgb A1c MFr Bld: 6.2 % (ref 4.6–6.5)

## 2021-07-02 MED ORDER — HYDROCHLOROTHIAZIDE 25 MG PO TABS: 25.0000 mg | ORAL_TABLET | Freq: Every day | ORAL | 3 refills | Status: DC

## 2021-07-02 NOTE — Assessment & Plan Note (Signed)
Significant weight gain since last visit. Strongly advised he get back into regular exercise and improve diet.

## 2021-07-02 NOTE — Assessment & Plan Note (Signed)
Evident on exam today bilaterally.  Decreased, 1+ pedal pulse to left DP and PT. 2+ PT and DP to right foot.  Recommended ABI's given weight gain, continued elevated BP, and decreased pedal pulse.  He agrees. ABI's ordered.

## 2021-07-02 NOTE — Assessment & Plan Note (Signed)
Influenza vaccine due and provided today. Other vaccines UTD.  PSA due and pending. Colonoscopy UTD, due 2028.  Discussed the importance of a healthy diet and regular exercise in order for weight loss, and to reduce the risk of further co-morbidity.  Exam today as noted. Labs pending.

## 2021-07-02 NOTE — Assessment & Plan Note (Signed)
Not on treatment.  Significant weight gain since last visit. Repeat lipid panel pending.  Discussed the importance of a healthy diet and regular exercise in order for weight loss, and to reduce the risk of further co-morbidity.

## 2021-07-02 NOTE — Assessment & Plan Note (Signed)
Chronic, stable. No concerns today.  

## 2021-07-02 NOTE — Assessment & Plan Note (Addendum)
Above goal today, during prior documented readings, also with some readings.  Increase HCTZ to 25 mg. Reviewed CMP from 2020 which was unremarkable for kidney function.   He will monitor BP at home and report readings in two weeks via MyChart.

## 2021-07-02 NOTE — Assessment & Plan Note (Signed)
Following with Urology, office notes from November 2021 and labs from 2022 reviewed.   Recommended he schedule a follow up visit.  No longer on testosterone gel. Continue off.

## 2021-07-02 NOTE — Progress Notes (Signed)
Subjective:    Patient ID: Timothy Delacruz, male    DOB: 1970-09-14, 51 y.o.   MRN: [id number]  HPI  Timothy Delacruz is a very pleasant 51 y.o. male who presents today for complete physical and follow up of chronic conditions.  Immunizations: -Tetanus: 2020 -Influenza: Due, completed today -Covid-19: 2 vaccines -Shingles: Completed Shingrix  Diet: Fair diet.  Exercise: No regular exercise. Is trying to improve activity. Plans on getting back water aerobics.   Eye exam: Completes annually  Dental exam: Completes semi-annually   Colonoscopy: Completed in 2021, due 2028 PSA: Due  He does check his BP at home with a fit bit wrist watch which runs 120's-130/80's-90's.   BP Readings from Last 3 Encounters:  07/02/21 (!) 148/82  05/14/20 (!) 153/88  01/30/20 (!) 171/90   Wt Readings from Last 3 Encounters:  07/02/21 (!) 475 lb (215.5 kg)  05/14/20 (!) 421 lb (191 kg)  01/30/20 (!) 421 lb (191 kg)        Review of Systems  Constitutional:  Negative for unexpected weight change.  HENT:  Negative for rhinorrhea.   Respiratory:  Negative for shortness of breath.   Cardiovascular:  Negative for chest pain.  Gastrointestinal:  Negative for constipation and diarrhea.  Genitourinary:  Negative for difficulty urinating.  Musculoskeletal:  Positive for arthralgias and back pain.  Skin:  Negative for rash.  Allergic/Immunologic: Negative for environmental allergies.  Neurological:  Negative for dizziness and headaches.  Psychiatric/Behavioral:  The patient is not nervous/anxious.         Past Medical History:  Diagnosis Date   Hyperlipidemia    Hypertension    Loss or death of partner 2013-04-02    Social History   Socioeconomic History   Marital status: Single    Spouse name: Not on file   Number of children: Not on file   Years of education: Not on file   Highest education level: Not on file  Occupational History   Not on file  Tobacco Use   Smoking  status: Never   Smokeless tobacco: Never  Vaping Use   Vaping Use: Never used  Substance and Sexual Activity   Alcohol use: Yes    Comment: occ   Drug use: No   Sexual activity: Not on file  Other Topics Concern   Not on file  Social History Narrative   Not on file   Social Determinants of Health   Financial Resource Strain: Not on file  Food Insecurity: Not on file  Transportation Needs: Not on file  Physical Activity: Not on file  Stress: Not on file  Social Connections: Not on file  Intimate Partner Violence: Not on file    Past Surgical History:  Procedure Laterality Date   COLONOSCOPY WITH PROPOFOL N/A 07/19/2019   Procedure: COLONOSCOPY WITH PROPOFOL;  Surgeon: Jonathon Bellows, MD;  Location: [facility] ENDOSCOPY;  Service: Gastroenterology;  Laterality: N/A;   LAPAROSCOPIC GASTRIC BANDING  02/2011    Family History  Problem Relation Age of Onset   Cancer Father        prostate   Heart attack Paternal Grandmother    Cancer Paternal Grandfather        Cancer   Cancer Paternal Aunt        lung    Allergies  Allergen Reactions   Lisinopril     REACTION: cough    Current Outpatient Medications on File Prior to Visit  Medication Sig Dispense Refill   calcium citrate-vitamin  D 200-200 MG-UNIT TABS Take 4 tablets by mouth daily.       diclofenac Sodium (VOLTAREN) 1 % GEL Apply 2 g topically 4 (four) times daily.     hydrochlorothiazide (HYDRODIURIL) 12.5 MG tablet TAKE 1 TABLET (12.5 MG TOTAL) BY MOUTH DAILY. FOR BLOOD PRESSURE. 90 tablet 3   Multiple Vitamins-Minerals (MULTIVITAMIN WITH MINERALS) tablet Take 1 tablet by mouth daily.       meloxicam (MOBIC) 15 MG tablet Take 1 tablet (15 mg total) by mouth daily as needed for pain. (Patient not taking: Reported on 07/02/2021) 30 tablet 0   Testosterone 20.25 MG/ACT (1.62%) GEL Apply 2 pumps daily Monday, Wednesday, Friday, Sunday and 1 pump daily Tuesday, Thursday, Saturday (Patient not taking: Reported on 07/02/2021) 75 g 1    No current facility-administered medications on file prior to visit.    BP (!) 148/82    Pulse 96    Temp (!) 97.1 F (36.2 C) (Temporal)    Ht 5\' 11"  (1.803 m)    Wt (!) 475 lb (215.5 kg)    SpO2 97%    BMI 66.25 kg/m  Objective:   Physical Exam HENT:     Right Ear: Tympanic membrane and ear canal normal.     Left Ear: Tympanic membrane and ear canal normal.     Nose: Nose normal.     Right Sinus: No maxillary sinus tenderness or frontal sinus tenderness.     Left Sinus: No maxillary sinus tenderness or frontal sinus tenderness.  Eyes:     Conjunctiva/sclera: Conjunctivae normal.  Neck:     Thyroid: No thyromegaly.     Vascular: No carotid bruit.  Cardiovascular:     Rate and Rhythm: Normal rate and regular rhythm.     Pulses:          Popliteal pulses are 2+ on the right side and 1+ on the left side.       Dorsalis pedis pulses are 2+ on the right side and 1+ on the left side.     Heart sounds: Normal heart sounds.  Pulmonary:     Effort: Pulmonary effort is normal.     Breath sounds: Normal breath sounds. No wheezing or rales.  Abdominal:     General: Bowel sounds are normal.     Palpations: Abdomen is soft.     Tenderness: There is no abdominal tenderness.  Musculoskeletal:        General: Normal range of motion.     Cervical back: Neck supple.  Skin:    General: Skin is warm and dry.     Comments: Dependent rubor to bilateral anterior lower extremities just proximal to ankle.  No ulceration.  Neurological:     Mental Status: He is alert and oriented to person, place, and time.     Cranial Nerves: No cranial nerve deficit.     Deep Tendon Reflexes: Reflexes are normal and symmetric.  Psychiatric:        Mood and Affect: Mood normal.          Assessment & Plan:      This visit occurred during the SARS-CoV-2 public health emergency.  Safety protocols were in place, including screening questions prior to the visit, additional usage of staff PPE, and  extensive cleaning of exam room while observing appropriate contact time as indicated for disinfecting solutions.

## 2021-07-02 NOTE — Patient Instructions (Signed)
Stop by the lab prior to leaving today. I will notify you of your results once received.   You will be contacted regarding your blood flow testing.  Please let us know if you have not been contacted within two weeks.   We increased the dose of your hydrochlorothiazide to 25 mg. I sent this to [facility].  Please send me blood pressure readings in 2 weeks via MyChart.  It was a pleasure to see you today!  Preventive Care 75-53 Years Old, Male Preventive care refers to lifestyle choices and visits with your health care provider that can promote health and wellness. Preventive care visits are also called wellness exams. What can I expect for my preventive care visit? Counseling During your preventive care visit, your health care provider may ask about your: Medical history, including: Past medical problems. Family medical history. Current health, including: Emotional well-being. Home life and relationship well-being. Sexual activity. Lifestyle, including: Alcohol, nicotine or tobacco, and drug use. Access to firearms. Diet, exercise, and sleep habits. Safety issues such as seatbelt and bike helmet use. Sunscreen use. Work and work Statistician. Physical exam Your health care provider will check your: Height and weight. These may be used to calculate your BMI (body mass index). BMI is a measurement that tells if you are at a healthy weight. Waist circumference. This measures the distance around your waistline. This measurement also tells if you are at a healthy weight and may help predict your risk of certain diseases, such as type 2 diabetes and high blood pressure. Heart rate and blood pressure. Body temperature. Skin for abnormal spots. What immunizations do I need? Vaccines are usually given at various ages, according to a schedule. Your health care provider will recommend vaccines for you based on your age, medical history, and lifestyle or other factors, such as travel or where  you work. What tests do I need? Screening Your health care provider may recommend screening tests for certain conditions. This may include: Lipid and cholesterol levels. Diabetes screening. This is done by checking your blood sugar (glucose) after you have not eaten for a while (fasting). Hepatitis B test. Hepatitis C test. HIV (human immunodeficiency virus) test. STI (sexually transmitted infection) testing, if you are at risk. Lung cancer screening. Prostate cancer screening. Colorectal cancer screening. Talk with your health care provider about your test results, treatment options, and if necessary, the need for more tests. Follow these instructions at home: Eating and drinking  Eat a diet that includes fresh fruits and vegetables, whole grains, lean protein, and low-fat dairy products. Take vitamin and mineral supplements as recommended by your health care provider. Do not drink alcohol if your health care provider tells you not to drink. If you drink alcohol: Limit how much you have to 0-2 drinks a day. Know how much alcohol is in your drink. In the [place] one drink equals one 12 oz bottle of beer (355 mL), one 5 oz glass of wine (148 mL), or one 1 oz glass of hard liquor (44 mL). Lifestyle Brush your teeth every morning and night with fluoride toothpaste. Floss one time each day. Exercise for at least 30 minutes 5 or more days each week. Do not use any products that contain nicotine or tobacco. These products include cigarettes, chewing tobacco, and vaping devices, such as e-cigarettes. If you need help quitting, ask your health care provider. Do not use drugs. If you are sexually active, practice safe sex. Use a condom or other form of protection  to prevent STIs. Take aspirin only as told by your health care provider. Make sure that you understand how much to take and what form to take. Work with your health care provider to find out whether it is safe and beneficial for you to  take aspirin daily. Find healthy ways to manage stress, such as: Meditation, yoga, or listening to music. Journaling. Talking to a trusted person. Spending time with friends and family. Minimize exposure to UV radiation to reduce your risk of skin cancer. Safety Always wear your seat belt while driving or riding in a vehicle. Do not drive: If you have been drinking alcohol. Do not ride with someone who has been drinking. When you are tired or distracted. While texting. If you have been using any mind-altering substances or drugs. Wear a helmet and other protective equipment during sports activities. If you have firearms in your house, make sure you follow all gun safety procedures. What's next? Go to your health care provider once a year for an annual wellness visit. Ask your health care provider how often you should have your eyes and teeth checked. Stay up to date on all vaccines. This information is not intended to replace advice given to you by your health care provider. Make sure you discuss any questions you have with your health care provider. Document Revised: 12/09/2020 Document Reviewed: 12/09/2020 Elsevier Patient Education  [place].

## 2021-07-05 LAB — HEPATITIS C ANTIBODY
Hepatitis C Ab: NONREACTIVE
SIGNAL TO CUT-OFF: 0.02 (ref <1.00)

## 2021-07-05 LAB — HIV ANTIBODY (ROUTINE TESTING W REFLEX): HIV 1&2 Ab, 4th Generation: NONREACTIVE

## 2021-07-30 ENCOUNTER — Ambulatory Visit (INDEPENDENT_AMBULATORY_CARE_PROVIDER_SITE_OTHER): Payer: 59

## 2021-07-30 ENCOUNTER — Other Ambulatory Visit: Payer: Self-pay

## 2021-07-30 DIAGNOSIS — R0989 Other specified symptoms and signs involving the circulatory and respiratory systems: Secondary | ICD-10-CM

## 2021-07-30 DIAGNOSIS — I739 Peripheral vascular disease, unspecified: Secondary | ICD-10-CM

## 2021-08-17 ENCOUNTER — Other Ambulatory Visit: Payer: Self-pay

## 2021-08-17 ENCOUNTER — Encounter: Payer: Self-pay | Admitting: Primary Care

## 2021-08-17 ENCOUNTER — Ambulatory Visit (INDEPENDENT_AMBULATORY_CARE_PROVIDER_SITE_OTHER): Payer: 59 | Admitting: Primary Care

## 2021-08-17 VITALS — BP 130/76 | HR 75 | Temp 98.6°F | Ht 71.0 in | Wt >= 6400 oz

## 2021-08-17 DIAGNOSIS — R0683 Snoring: Secondary | ICD-10-CM | POA: Diagnosis not present

## 2021-08-17 DIAGNOSIS — M25562 Pain in left knee: Secondary | ICD-10-CM | POA: Diagnosis not present

## 2021-08-17 NOTE — Assessment & Plan Note (Signed)
Symptoms highly suggestive of sleep apnea.  STOP-BANG sleep apnea questionnaire score of 8 today.  Referral placed to pulmonology for evaluation.

## 2021-08-17 NOTE — Assessment & Plan Note (Signed)
To the left knee without trauma/injury.  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 Low suspicion for gout but will keep on differential list.  Discussed options with patient today which include physical therapy, x-ray.  He is working on getting back into the pool and will start with water therapy first.  Continue diclofenac gel/IcyHot as needed. He will update if symptoms do not continue to improve.

## 2021-08-17 NOTE — Progress Notes (Signed)
Subjective:    Patient ID: Timothy Delacruz, male    DOB: 05/04/1971, 51 y.o.   MRN: [id number]  HPI  Timothy Delacruz is a very pleasant 51 y.o. male with a history of peripheral vascular disease, hypertension, morbid obesity, hyperlipidemia who presents today to discuss snoring and knee pain.  1) Snoring: Chronic history of snoring for years, especially when sleeping near an air conditioner and sinus get congested. He's been told over the years by friends that he stops breathing during the night.  He's noticed daytime tiredness regardless of the amount of sleep he gets the night prior. He will occasionally nod off when watching TV. He sometimes naps during the day.  He works from 3p-11:30p, goes to his mothers house to visit, gets home around 2a, sleeps about 7-8 hours.   2) Acute Knee Pain: Acute knee pain to the left anterior knee since June 27, 2021. The night prior he went out to dinner with some friends, doesn't recall an injury or incident.  He woke up the next morning and noticed the pain.  He describes his pain as a "tightness", instability, waxes and wanes.   He's applying Icy Hot and Voltaren Gel with temporary improvement.   He denies swelling, redness, weakness, injury/trauma, overuse. He has noticed the knee buckling a few times when standing.   He is looking into getting back to water aerobics. He has noticed slight improvement.    Review of Systems  Constitutional:  Positive for fatigue.  Respiratory:  Negative for shortness of breath.        Snoring  Cardiovascular:  Negative for chest pain.  Musculoskeletal:  Positive for arthralgias.        Past Medical History:  Diagnosis Date   Hyperlipidemia    Hypertension    Loss or death of partner 04-13-13   Low testosterone 03/22/2013    Social History   Socioeconomic History   Marital status: Single    Spouse name: Not on file   Number of children: Not on file   Years of education: Not on file    Highest education level: Not on file  Occupational History   Not on file  Tobacco Use   Smoking status: Never   Smokeless tobacco: Never  Vaping Use   Vaping Use: Never used  Substance and Sexual Activity   Alcohol use: Yes    Comment: occ   Drug use: No   Sexual activity: Not on file  Other Topics Concern   Not on file  Social History Narrative   Not on file   Social Determinants of Health   Financial Resource Strain: Not on file  Food Insecurity: Not on file  Transportation Needs: Not on file  Physical Activity: Not on file  Stress: Not on file  Social Connections: Not on file  Intimate Partner Violence: Not on file    Past Surgical History:  Procedure Laterality Date   COLONOSCOPY WITH PROPOFOL N/A 07/19/2019   Procedure: COLONOSCOPY WITH PROPOFOL;  Surgeon: Jonathon Bellows, MD;  Location: [facility] ENDOSCOPY;  Service: Gastroenterology;  Laterality: N/A;   LAPAROSCOPIC GASTRIC BANDING  02/2011    Family History  Problem Relation Age of Onset   Cancer Father        prostate   Heart attack Paternal Grandmother    Cancer Paternal Grandfather        Cancer   Cancer Paternal Aunt        lung    Allergies  Allergen  Reactions   Lisinopril     REACTION: cough    Current Outpatient Medications on File Prior to Visit  Medication Sig Dispense Refill   calcium citrate-vitamin D 200-200 MG-UNIT TABS Take 4 tablets by mouth daily.       diclofenac Sodium (VOLTAREN) 1 % GEL Apply 2 g topically 4 (four) times daily.     hydrochlorothiazide (HYDRODIURIL) 25 MG tablet Take 1 tablet (25 mg total) by mouth daily. For blood pressure. 90 tablet 3   Multiple Vitamins-Minerals (MULTIVITAMIN WITH MINERALS) tablet Take 1 tablet by mouth daily.       No current facility-administered medications on file prior to visit.    BP 130/76    Pulse 75    Temp 98.6 F (37 C) (Oral)    Ht 5\' 11"  (1.803 m)    Wt (!) 465 lb (210.9 kg)    SpO2 98%    BMI 64.85 kg/m  Objective:   Physical  Exam Cardiovascular:     Rate and Rhythm: Normal rate and regular rhythm.  Pulmonary:     Effort: Pulmonary effort is normal.     Breath sounds: Normal breath sounds. No wheezing or rales.  Musculoskeletal:     Cervical back: Neck supple.     Right knee: No bony tenderness. Normal range of motion.     Left knee: No bony tenderness. Normal range of motion.       Legs:     Comments: Ambulates well overall without assistance.  Skin:    General: Skin is warm and dry.  Neurological:     Mental Status: He is alert and oriented to person, place, and time.          Assessment & Plan:      This visit occurred during the SARS-CoV-2 public health emergency.  Safety protocols were in place, including screening questions prior to the visit, additional usage of staff PPE, and extensive cleaning of exam room while observing appropriate contact time as indicated for disinfecting solutions.

## 2021-08-17 NOTE — Patient Instructions (Signed)
You will be contacted regarding your referral to pulmonology.  Please let us know if you have not been contacted within two weeks.   Continue working on weight loss!  Resume water aerobics/swimming.  Continue Voltaren gel and/or IcyHot as needed.  It was a pleasure to see you today!

## 2021-08-24 NOTE — Telephone Encounter (Signed)
Timothy Delacruz, can you comment?

## 2021-08-25 NOTE — Telephone Encounter (Signed)
I am honestly not sure. I do not see any communication noted from Pulmonary (recent referral) where they have tried to contact the patient. Sometimes when they receive referrals they will send the patient a letter to contact the office (either in [place] or via mail) or they will send a Mychart Message to the patient but I do not see neither of these two forms of communication. His Mychart is already set up so there should be no sort of verification needed at this point to access any type of communication within [place].  ? ?Maybe Amy knows and can answer further of what this may be about? ?

## 2021-08-27 NOTE — Telephone Encounter (Signed)
Thanks! ?Yes, any sort of clarification could help! ?Can one of you contact the patient? I am out of the office this week. ?

## 2021-08-27 NOTE — Telephone Encounter (Signed)
Mandy, Any idea on this? ?I have no idea what to tell the patient.  ?

## 2021-08-27 NOTE — Telephone Encounter (Signed)
Can you assist with this Amy?  ?Thanks! ?

## 2021-09-15 ENCOUNTER — Ambulatory Visit (INDEPENDENT_AMBULATORY_CARE_PROVIDER_SITE_OTHER): Payer: 59 | Admitting: Adult Health

## 2021-09-15 ENCOUNTER — Other Ambulatory Visit: Payer: Self-pay

## 2021-09-15 ENCOUNTER — Encounter: Payer: Self-pay | Admitting: Adult Health

## 2021-09-15 DIAGNOSIS — R400 Somnolence: Secondary | ICD-10-CM

## 2021-09-15 NOTE — Assessment & Plan Note (Signed)
Healthy weight loss discussed 

## 2021-09-15 NOTE — Addendum Note (Signed)
Addended by: Mathis Bud on: 09/15/2021 11:10 AM ? ? Modules accepted: Orders ? ?

## 2021-09-15 NOTE — Patient Instructions (Addendum)
Set up for split-night sleep study. ?Work on healthy weight loss ?Do not drive if sleepy ?Follow-up in 6 to 8 weeks to discuss sleep study results and treatment plan ? ?

## 2021-09-15 NOTE — Progress Notes (Signed)
? ?'@Patient'$  ID: Timothy Delacruz, male    DOB: [birth year], 51 y.o.   MRN: [id number] ? ?Chief Complaint  ?Patient presents with  ? Consult  ? ? ?Referring provider: ?Pleas Koch, NP ? ?HPI: ?51 year old male seen for sleep consult September 15, 2021 for snoring, daytime sleepiness and restless sleep, witnessed apneic events ?Medical history significant for morbid obesity with BMI at 59 ? ?TEST/EVENTS :  ? ?09/15/2021 Sleep Consult  ?Patient presents for sleep consult today.  He was referred kindly by his primary care provider Loma Boston, NP .  Patient complains of snoring, daytime sleepiness, restless sleep and witnessed apneic events for many years.  He complains that he wakes up several times at night.  Does not wake up refreshed.  Typically goes to bed about 2 AM.  Usually takes about 15 minutes to go to sleep.  Gets up about 9 to 10 AM.  Usually gets up 2-3 times a night .  He works second shift.  Patient does not operate heavy machinery.  Weight is up about 10 pounds over the last couple years.  Current weight is at 460 pounds with a BMI at 64.  Patient has never had a sleep study before. ?Caffeine intake 2 cups of coffee. Rare soda. No energy drinks or pills.  ?Epworth score is 3 with mainly sleepiness with watching TV. ?Denies any symptoms suspicious for cataplexy or sleep paralysis. ?Does not use any sleep aids. Occasionally will use melatonin but rarely.   Is not on any sedating medications ?No history of stroke or congestive heart failure.  ?Has chronic nasal stuffiness.  ? ? ?Social history.  Patient is single.  Lives alone.  Works at [employer].  He does not have any children.  Has rare alcohol intake.  Never smoker.  No drug use. Not very active , sedentary . Works from home.  ?Wants to get back into water aerobics. Has trouble with knees and mobility .  ? ?Family history positive for chronic allergies, asthma, breast cancer, melanoma, prostate cancer. DM  ? ?Medical history is  significant for hyperlipidemia, hypertension,  low testosterone, bilateral Knee DJD.  ? ?Surgical history : Lap band surgery 2012 , lost 100lbs. Regained all weight back.  ? ?Allergies  ?Allergen Reactions  ? Lisinopril   ?  REACTION: cough  ? ? ?Immunization History  ?Administered Date(s) Administered  ? Influenza,inj,Quad PF,6+ Mos 06/30/2021  ? Influenza-Unspecified 04/08/2019  ? PFIZER(Purple Top)SARS-COV-2 Vaccination 09/13/2019, 10/08/2019  ? Td 06/09/2009  ? Tdap 06/13/2019  ? Zoster Recombinat (Shingrix) 04/08/2019, 06/13/2019  ? ? ?Past Medical History:  ?Diagnosis Date  ? Hyperlipidemia   ? Hypertension   ? Loss or death of partner 04-16-2013  ? Low testosterone 03/22/2013  ? ? ?Tobacco History: ?Social History  ? ?Tobacco Use  ?Smoking Status Never  ?Smokeless Tobacco Never  ? ?Counseling given: Not Answered ? ? ?Outpatient Medications Prior to Visit  ?Medication Sig Dispense Refill  ? calcium citrate-vitamin D 200-200 MG-UNIT TABS Take 4 tablets by mouth daily.      ? diclofenac Sodium (VOLTAREN) 1 % GEL Apply 2 g topically 4 (four) times daily.    ? hydrochlorothiazide (HYDRODIURIL) 25 MG tablet Take 1 tablet (25 mg total) by mouth daily. For blood pressure. 90 tablet 3  ? Multiple Vitamins-Minerals (MULTIVITAMIN WITH MINERALS) tablet Take 1 tablet by mouth daily.      ? ?No facility-administered medications prior to visit.  ? ? ? ?Review of Systems:  ? ?  Constitutional:   No  weight loss, night sweats,  Fevers, chills,  ?+fatigue, or  lassitude. ? ?HEENT:   No headaches,  Difficulty swallowing,  Tooth/dental problems, or  Sore throat,  ?              No sneezing, itching, ear ache, nasal congestion, post nasal drip,  ? ?CV:  No chest pain,  Orthopnea, PND, swelling in lower extremities, anasarca, dizziness, palpitations, syncope.  ? ?GI  No heartburn, indigestion, abdominal pain, nausea, vomiting, diarrhea, change in bowel habits, loss of appetite, bloody stools.  ? ?Resp: No shortness of breath with  exertion or at rest.  No excess mucus, no productive cough,  No non-productive cough,  No coughing up of blood.  No change in color of mucus.  No wheezing.  No chest wall deformity ? ?Skin: no rash or lesions. ? ?GU: no dysuria, change in color of urine, no urgency or frequency.  No flank pain, no hematuria  ? ?MS:  + knee pain  ? ? ? ?Physical Exam ? ?BP 126/74 (BP Location: Left Arm, Cuff Size: Large)   Pulse 89   Temp 98.1 ?F (36.7 ?C) (Temporal)   Ht '5\' 11"'$  (1.803 m)   Wt (!) 460 lb 12.8 oz (209 kg)   SpO2 98%   BMI 64.27 kg/m?  ? ?GEN: A/Ox3; pleasant , NAD, well nourished . BMI at 64  ?  ?HEENT:  [place]/AT,  EACs-clear, TMs-wnl, NOSE-clear, THROAT-clear, no lesions, no postnasal drip or exudate noted. Class 3-4 MP airway  ? ?NECK:  Supple w/ fair ROM; no JVD; normal carotid impulses w/o bruits; no thyromegaly or nodules palpated; no lymphadenopathy.   ? ?RESP  Clear  P & A; w/o, wheezes/ rales/ or rhonchi. no accessory muscle use, no dullness to percussion ? ?CARD:  RRR, no m/r/g, 1+ peripheral edema, pulses intact, no cyanosis or clubbing. ? ?GI:   Soft & nt; nml bowel sounds; no organomegaly or masses detected.  ? ?Musco: Warm bil, no deformities or joint swelling noted.  ? ?Neuro: alert, no focal deficits noted.   ? ?Skin: Warm, no lesions or rashes ? ? ? ?Lab Results: ? ?CBC ?   ?Component Value Date/Time  ? WBC 8.3 07/02/2021 1306  ? RBC 4.91 07/02/2021 1306  ? HGB 14.3 07/02/2021 1306  ? HCT 43.2 07/02/2021 1306  ? HCT 45.6 05/13/2020 1425  ? PLT 261.0 07/02/2021 1306  ? MCV 88.1 07/02/2021 1306  ? MCH 28.4 03/02/2011 0455  ? MCHC 33.1 07/02/2021 1306  ? RDW 14.0 07/02/2021 1306  ? LYMPHSABS 1.9 03/02/2011 0455  ? MONOABS 0.9 03/02/2011 0455  ? EOSABS 0.1 03/02/2011 0455  ? BASOSABS 0.0 03/02/2011 0455  ? ? ?BMET ?   ?Component Value Date/Time  ? NA 135 07/02/2021 1306  ? K 4.2 07/02/2021 1306  ? CL 101 07/02/2021 1306  ? CO2 26 07/02/2021 1306  ? GLUCOSE 96 07/02/2021 1306  ? BUN 13 07/02/2021 1306  ?  CREATININE 0.74 07/02/2021 1306  ? CREATININE 0.84 12/15/2010 0941  ? CALCIUM 9.3 07/02/2021 1306  ? GFRNONAA >60 02/22/2011 1030  ? GFRAA >60 02/22/2011 1030  ? ? ?BNP ?No results found for: BNP ? ?ProBNP ?No results found for: PROBNP ? ?Imaging: ?No results found. ? ? ? ?   ? View : No data to display.  ?  ?  ?  ? ? ?No results found for: NITRICOXIDE ? ? ? ? ? ?Assessment & Plan:  ? ?Daytime  sleepiness ?Daytime sleepiness, snoring , restless sleep , witnessed apneic events along with BMI at 64.  ?Concern for component of Hypoventilation with weight at 460 lbs  ?Will set up for Split night study - may transition to BIPAP if needed  ?Patient education given  ? ?- discussed how weight can impact sleep and risk for sleep disordered breathing ?- discussed options to assist with weight loss: combination of diet modification, cardiovascular and strength training exercises ?  ?- had an extensive discussion regarding the adverse health consequences related to untreated sleep disordered breathing ?- specifically discussed the risks for hypertension, coronary artery disease, cardiac dysrhythmias, cerebrovascular disease, and diabetes ?- lifestyle modification discussed ?  ?- discussed how sleep disruption can increase risk of accidents, particularly when driving ?- safe driving practices were discussed ?  ?Plan  ?Patient Instructions  ?Set up for split-night sleep study. ?Work on healthy weight loss ?Do not drive if sleepy ?Follow-up in 6 to 8 weeks to discuss sleep study results and treatment plan ? ?  ? ? ?OBESITY, MORBID ?Healthy weight loss discussed  ? ? ? ?Rexene Edison, NP ?09/15/2021 ? ?

## 2021-09-15 NOTE — Progress Notes (Signed)
Reviewed and agree with assessment/plan. ? ? ?Chesley Mires, MD ?[place] ?09/15/2021, 1:00 PM ?Pager:  [phone] ? ?

## 2021-09-15 NOTE — Assessment & Plan Note (Signed)
Daytime sleepiness, snoring , restless sleep , witnessed apneic events along with BMI at 64.  ?Concern for component of Hypoventilation with weight at 460 lbs  ?Will set up for Split night study - may transition to BIPAP if needed  ?Patient education given  ? ?- discussed how weight can impact sleep and risk for sleep disordered breathing ?- discussed options to assist with weight loss: combination of diet modification, cardiovascular and strength training exercises ?  ?- had an extensive discussion regarding the adverse health consequences related to untreated sleep disordered breathing ?- specifically discussed the risks for hypertension, coronary artery disease, cardiac dysrhythmias, cerebrovascular disease, and diabetes ?- lifestyle modification discussed ?  ?- discussed how sleep disruption can increase risk of accidents, particularly when driving ?- safe driving practices were discussed ?  ?Plan  ?Patient Instructions  ?Set up for split-night sleep study. ?Work on healthy weight loss ?Do not drive if sleepy ?Follow-up in 6 to 8 weeks to discuss sleep study results and treatment plan ? ?  ? ?

## 2021-09-15 NOTE — Telephone Encounter (Signed)
Pt scheduled with Pulmonary/Sleep Medicine - Rexene Edison, NP 09/15/21 ? ?Nothing further needed.  ? ?

## 2021-09-30 NOTE — Telephone Encounter (Signed)
PCCs please advise on pt's email. He said he received a latter from insurance about approving his sleep study until 4/30. Pt wants to go ahead and schedule so he can request of work and be sure its performed by the insurance deadline. Thanks.  ?

## 2021-11-10 ENCOUNTER — Ambulatory Visit (HOSPITAL_BASED_OUTPATIENT_CLINIC_OR_DEPARTMENT_OTHER): Payer: 59 | Attending: Adult Health | Admitting: Pulmonary Disease

## 2021-11-10 DIAGNOSIS — G4733 Obstructive sleep apnea (adult) (pediatric): Secondary | ICD-10-CM | POA: Insufficient documentation

## 2021-11-10 DIAGNOSIS — G4736 Sleep related hypoventilation in conditions classified elsewhere: Secondary | ICD-10-CM | POA: Insufficient documentation

## 2021-11-10 DIAGNOSIS — R400 Somnolence: Secondary | ICD-10-CM | POA: Diagnosis not present

## 2021-11-12 DIAGNOSIS — G4733 Obstructive sleep apnea (adult) (pediatric): Secondary | ICD-10-CM | POA: Diagnosis not present

## 2021-11-12 DIAGNOSIS — R400 Somnolence: Secondary | ICD-10-CM

## 2021-11-12 NOTE — Progress Notes (Signed)
Called and spoke with patient, scheduled to see Rexene Edison NP on Thursday June 1st at 11:00 am, advised to arrive by 10:45 am for check in.  He verbalized understanding.  Nothing further needed.

## 2021-11-12 NOTE — Procedures (Signed)
Patient Name: Timothy Delacruz, Timothy Delacruz Date: 11/10/2021 Gender: Male D.O.B: September 29, 1970 Age (years): 19 Referring Provider: Tammy Parrett Height (inches): 71 Interpreting Physician: Kara Mead MD, ABSM Weight (lbs): 457 RPSGT: Jorge Ny BMI: 64 MRN: [id number] Neck Size: 20.00 <br> <br> CLINICAL INFORMATION Sleep Study Type: NPSG    Indication for sleep study: Excessive Daytime Sleepiness, Hypertension, Obesity, Snoring    Epworth Sleepiness Score: 4    SLEEP STUDY TECHNIQUE As per the AASM Manual for the Scoring of Sleep and Associated Events v2.3 (April 2016) with a hypopnea requiring 4% desaturations.  The channels recorded and monitored were frontal, central and occipital EEG, electrooculogram (EOG), submentalis EMG (chin), nasal and oral airflow, thoracic and abdominal wall motion, anterior tibialis EMG, snore microphone, electrocardiogram, and pulse oximetry.  MEDICATIONS Medications self-administered by patient taken the night of the study : N/A  SLEEP ARCHITECTURE The study was initiated at 10:12:26 PM and ended at 4:44:44 AM.  Sleep onset time was 22.5 minutes and the sleep efficiency was 61.1%%. The total sleep time was 239.8 minutes.  Stage REM latency was 305.0 minutes.  The patient spent 16.7%% of the night in stage N1 sleep, 72.9%% in stage N2 sleep, 0.0%% in stage N3 and 10.4% in REM.  Alpha intrusion was absent.  Supine sleep was 52.09%.  RESPIRATORY PARAMETERS The overall apnea/hypopnea index (AHI) was 80.1 per hour. There were 159 total apneas, including 156 obstructive, 3 central and 0 mixed apneas. There were 161 hypopneas and 75 RERAs.  The AHI during Stage REM sleep was 86.4 per hour.  AHI while supine was 111.0 per hour.  The mean oxygen saturation was 93.2%. The minimum SpO2 during sleep was 77.0%.  moderate snoring was noted during this study.  CARDIAC DATA The 2 lead EKG demonstrated sinus rhythm. The mean heart rate was 59.8  beats per minute. Other EKG findings include: PVCs.   LEG MOVEMENT DATA The total PLMS were 0 with a resulting PLMS index of 0.0. Associated arousal with leg movement index was 6.3 .  IMPRESSIONS - Severe obstructive sleep apnea occurred during this study (AHI = 80.1/h). Study could not be split due to insufficient sleep time by the cutoff mark. - No significant central sleep apnea occurred during this study (CAI = 0.8/h). - Moderate oxygen desaturation was noted during this study (Min O2 = 77.0%). - The patient snored with moderate snoring volume. - EKG findings include PVCs. - Clinically significant periodic limb movements did not occur during sleep. Associated arousals were significant.   DIAGNOSIS - Obstructive Sleep Apnea (G47.33) - Nocturnal Hypoxemia (G47.36)   RECOMMENDATIONS - Therapeutic CPAP titration to determine optimal pressure required to alleviate sleep disordered breathing. - Positional therapy avoiding supine position during sleep. - Avoid alcohol, sedatives and other CNS depressants that may worsen sleep apnea and disrupt normal sleep architecture. - Sleep hygiene should be reviewed to assess factors that may improve sleep quality. - Weight management and regular exercise should be initiated or continued if appropriate.   Kara Mead MD Board Certified in [place]

## 2021-11-25 ENCOUNTER — Ambulatory Visit (INDEPENDENT_AMBULATORY_CARE_PROVIDER_SITE_OTHER): Payer: 59 | Admitting: Adult Health

## 2021-11-25 ENCOUNTER — Encounter: Payer: Self-pay | Admitting: Adult Health

## 2021-11-25 DIAGNOSIS — G4733 Obstructive sleep apnea (adult) (pediatric): Secondary | ICD-10-CM | POA: Diagnosis not present

## 2021-11-25 NOTE — Assessment & Plan Note (Signed)
Very severe sleep apnea.  Split-night sleep study Nov 10, 2021 showed very severe sleep apnea with AHI at 80.1/hour.  Patient will begin CPAP therapy.  Patient education was given on sleep apnea and CPAP care.  - discussed how weight can impact sleep and risk for sleep disordered breathing - discussed options to assist with weight loss: combination of diet modification, cardiovascular and strength training exercises   - had an extensive discussion regarding the adverse health consequences related to untreated sleep disordered breathing - specifically discussed the risks for hypertension, coronary artery disease, cardiac dysrhythmias, cerebrovascular disease, and diabetes - lifestyle modification discussed   - discussed how sleep disruption can increase risk of accidents, particularly when driving - safe driving practices were discussed   Begin auto CPAP 5 to 20 cm H2O.  Plan  Patient Instructions  Begin CPAP at bedtime and with naps Goal is to wear your CPAP all night long, for at least 6 hours or more Healthy sleep regimen Do not drive if sleepy Work on healthy weight loss Follow-up in 2 to 3 months and as needed

## 2021-11-25 NOTE — Addendum Note (Signed)
Addended by: Vanessa Barbara on: 11/25/2021 03:48 PM   Modules accepted: Orders

## 2021-11-25 NOTE — Progress Notes (Signed)
$'@Patient'T$  ID: Timothy Delacruz, male    DOB: 1970-11-16, 51 y.o.   MRN: [id number]  Chief Complaint  Patient presents with   Follow-up    Referring provider: Pleas Koch, NP  HPI: 51 year old male seen for sleep consult September 15, 2021 for snoring, daytime sleepiness and restless sleep with witnessed apneic events.  Patient was found to have severe obstructive sleep apnea Medical history significant for morbid obesity (weight is 457 pounds)  TEST/EVENTS :  Split night  Nov 10, 2021 showed very severe sleep apnea with a AHI at 80.1/hour, AHI while supine 111/hour, (no CPAP titration was done)  11/25/2021 Follow up : OSA  Patient returns for a 51-monthfollow-up.  Patient was seen last visit for sleep consult for snoring, daytime sleepiness and restless sleep.  He also had witnessed apneic events.  Patient was set up for split-night sleep study that was completed on Nov 10, 2021.  This showed very severe sleep apnea with AHI at 80.1/hour and AHI while supine at 111/hour.  CPAP titration was not done during the study.  We discussed his sleep study results.  Went over treatment options including weight loss and CPAP therapy.  Patient has agreed to begin on CPAP.     Allergies  Allergen Reactions   Lisinopril     REACTION: cough    Immunization History  Administered Date(s) Administered   Influenza,inj,Quad PF,6+ Mos 06/30/2021   Influenza-Unspecified 04/08/2019   PFIZER(Purple Top)SARS-COV-2 Vaccination 09/13/2019, 10/08/2019   Td 06/09/2009   Tdap 06/13/2019   Zoster Recombinat (Shingrix) 04/08/2019, 06/13/2019    Past Medical History:  Diagnosis Date   Hyperlipidemia    Hypertension    Loss or death of partner 909/26/2014  Low testosterone 03/22/2013    Tobacco History: Social History   Tobacco Use  Smoking Status Never  Smokeless Tobacco Never   Counseling given: Not Answered   Outpatient Medications Prior to Visit  Medication Sig Dispense Refill   calcium  citrate-vitamin D 200-200 MG-UNIT TABS Take 4 tablets by mouth daily.       diclofenac Sodium (VOLTAREN) 1 % GEL Apply 2 g topically 4 (four) times daily.     hydrochlorothiazide (HYDRODIURIL) 25 MG tablet Take 1 tablet (25 mg total) by mouth daily. For blood pressure. 90 tablet 3   Multiple Vitamins-Minerals (MULTIVITAMIN WITH MINERALS) tablet Take 1 tablet by mouth daily.       No facility-administered medications prior to visit.     Review of Systems:   Constitutional:   No  weight loss, night sweats,  Fevers, chills,  +fatigue, or  lassitude.  HEENT:   No headaches,  Difficulty swallowing,  Tooth/dental problems, or  Sore throat,                No sneezing, itching, ear ache, nasal congestion, post nasal drip,   CV:  No chest pain,  Orthopnea, PND, swelling in lower extremities, anasarca, dizziness, palpitations, syncope.   GI  No heartburn, indigestion, abdominal pain, nausea, vomiting, diarrhea, change in bowel habits, loss of appetite, bloody stools.   Resp: No shortness of breath with exertion or at rest.  No excess mucus, no productive cough,  No non-productive cough,  No coughing up of blood.  No change in color of mucus.  No wheezing.  No chest wall deformity  Skin: no rash or lesions.  GU: no dysuria, change in color of urine, no urgency or frequency.  No flank pain, no hematuria   MS:  No joint pain or swelling.  No decreased range of motion.  No back pain.    Physical Exam  BP 106/80 (BP Location: Left Arm, Patient Position: Sitting, Cuff Size: Large)   Pulse 75   Temp 97.9 F (36.6 C) (Oral)   Ht '5\' 11"'$  (1.803 m)   Wt (!) 460 lb (208.7 kg)   SpO2 98%   BMI 64.16 kg/m   GEN: A/Ox3; pleasant , NAD, well nourished    HEENT:  [place]/AT,  NOSE-clear, THROAT-clear, no lesions, no postnasal drip or exudate noted. Class 3 MP airway   NECK:  Supple w/ fair ROM; no JVD; normal carotid impulses w/o bruits; no thyromegaly or nodules palpated; no lymphadenopathy.     RESP  Clear  P & A; w/o, wheezes/ rales/ or rhonchi. no accessory muscle use, no dullness to percussion  CARD:  RRR, no m/r/g, no peripheral edema, pulses intact, no cyanosis or clubbing.  GI:   Soft & nt; nml bowel sounds; no organomegaly or masses detected.   Musco: Warm bil, no deformities or joint swelling noted.   Neuro: alert, no focal deficits noted.    Skin: Warm, no lesions or rashes    Lab Results:  CBC   BNP No results found for: BNP  ProBNP No results found for: PROBNP  Imaging: Split night study  Result Date: 11/10/2021 Rigoberto Noel, MD     11/12/2021 10:52 AM Patient Name: Mamie Levers Date: 11/10/2021 Gender: Male D.O.B: [birth year] Age (years): 63 Referring Provider: Karinna Beadles Height (inches): 71 Interpreting Physician: Kara Mead MD, ABSM Weight (lbs): 457 RPSGT: Jorge Ny BMI: 64 MRN: [id number] Neck Size: 20.00 <br> <br> CLINICAL INFORMATION Sleep Study Type: NPSG Indication for sleep study: Excessive Daytime Sleepiness, Hypertension, Obesity, Snoring Epworth Sleepiness Score: 4 SLEEP STUDY TECHNIQUE As per the AASM Manual for the Scoring of Sleep and Associated Events v2.3 (April 2016) with a hypopnea requiring 4% desaturations. The channels recorded and monitored were frontal, central and occipital EEG, electrooculogram (EOG), submentalis EMG (chin), nasal and oral airflow, thoracic and abdominal wall motion, anterior tibialis EMG, snore microphone, electrocardiogram, and pulse oximetry. MEDICATIONS Medications self-administered by patient taken the night of the study : N/A SLEEP ARCHITECTURE The study was initiated at 10:12:26 PM and ended at 4:44:44 AM. Sleep onset time was 22.5 minutes and the sleep efficiency was 61.1%%. The total sleep time was 239.8 minutes. Stage REM latency was 305.0 minutes. The patient spent 16.7%% of the night in stage N1 sleep, 72.9%% in stage N2 sleep, 0.0%% in stage N3 and 10.4% in REM. Alpha intrusion was  absent. Supine sleep was 52.09%. RESPIRATORY PARAMETERS The overall apnea/hypopnea index (AHI) was 80.1 per hour. There were 159 total apneas, including 156 obstructive, 3 central and 0 mixed apneas. There were 161 hypopneas and 75 RERAs. The AHI during Stage REM sleep was 86.4 per hour. AHI while supine was 111.0 per hour. The mean oxygen saturation was 93.2%. The minimum SpO2 during sleep was 77.0%. moderate snoring was noted during this study. CARDIAC DATA The 2 lead EKG demonstrated sinus rhythm. The mean heart rate was 59.8 beats per minute. Other EKG findings include: PVCs. LEG MOVEMENT DATA The total PLMS were 0 with a resulting PLMS index of 0.0. Associated arousal with leg movement index was 6.3 . IMPRESSIONS - Severe obstructive sleep apnea occurred during this study (AHI = 80.1/h). Study could not be split due to insufficient sleep time by the cutoff mark. - No significant central sleep apnea  occurred during this study (CAI = 0.8/h). - Moderate oxygen desaturation was noted during this study (Min O2 = 77.0%). - The patient snored with moderate snoring volume. - EKG findings include PVCs. - Clinically significant periodic limb movements did not occur during sleep. Associated arousals were significant. DIAGNOSIS - Obstructive Sleep Apnea (G47.33) - Nocturnal Hypoxemia (G47.36) RECOMMENDATIONS - Therapeutic CPAP titration to determine optimal pressure required to alleviate sleep disordered breathing. - Positional therapy avoiding supine position during sleep. - Avoid alcohol, sedatives and other CNS depressants that may worsen sleep apnea and disrupt normal sleep architecture. - Sleep hygiene should be reviewed to assess factors that may improve sleep quality. - Weight management and regular exercise should be initiated or continued if appropriate. Kara Mead MD Board Certified in [place]  Result Date: 11/11/2021 Ordered by an unspecified provider.         View : No  data to display.          No results found for: NITRICOXIDE      Assessment & Plan:   OSA (obstructive sleep apnea) Very severe sleep apnea.  Split-night sleep study Nov 10, 2021 showed very severe sleep apnea with AHI at 80.1/hour.  Patient will begin CPAP therapy.  Patient education was given on sleep apnea and CPAP care.  - discussed how weight can impact sleep and risk for sleep disordered breathing - discussed options to assist with weight loss: combination of diet modification, cardiovascular and strength training exercises   - had an extensive discussion regarding the adverse health consequences related to untreated sleep disordered breathing - specifically discussed the risks for hypertension, coronary artery disease, cardiac dysrhythmias, cerebrovascular disease, and diabetes - lifestyle modification discussed   - discussed how sleep disruption can increase risk of accidents, particularly when driving - safe driving practices were discussed   Begin auto CPAP 5 to 20 cm H2O.  Plan  Patient Instructions  Begin CPAP at bedtime and with naps Goal is to wear your CPAP all night long, for at least 6 hours or more Healthy sleep regimen Do not drive if sleepy Work on healthy weight loss Follow-up in 2 to 3 months and as needed      OBESITY, MORBID Healthy weight loss     Rexene Edison, NP 11/25/2021

## 2021-11-25 NOTE — Patient Instructions (Signed)
Begin CPAP at bedtime and with naps Goal is to wear your CPAP all night long, for at least 6 hours or more Healthy sleep regimen Do not drive if sleepy Work on healthy weight loss Follow-up in 2 to 3 months and as needed

## 2021-11-25 NOTE — Assessment & Plan Note (Signed)
Healthy weight loss 

## 2021-11-28 IMAGING — DX DG KNEE COMPLETE 4+V*R*
5 series · 5 of 5 positions shown · non-contrast
Comparison: AP view of the left knee with weight-bearing for
comparison dated 10/22/2019

CLINICAL DATA: Acute right knee pain. No known trauma.

EXAM:
RIGHT KNEE - COMPLETE 4+ VIEW

[knee ap (1 of 2)]
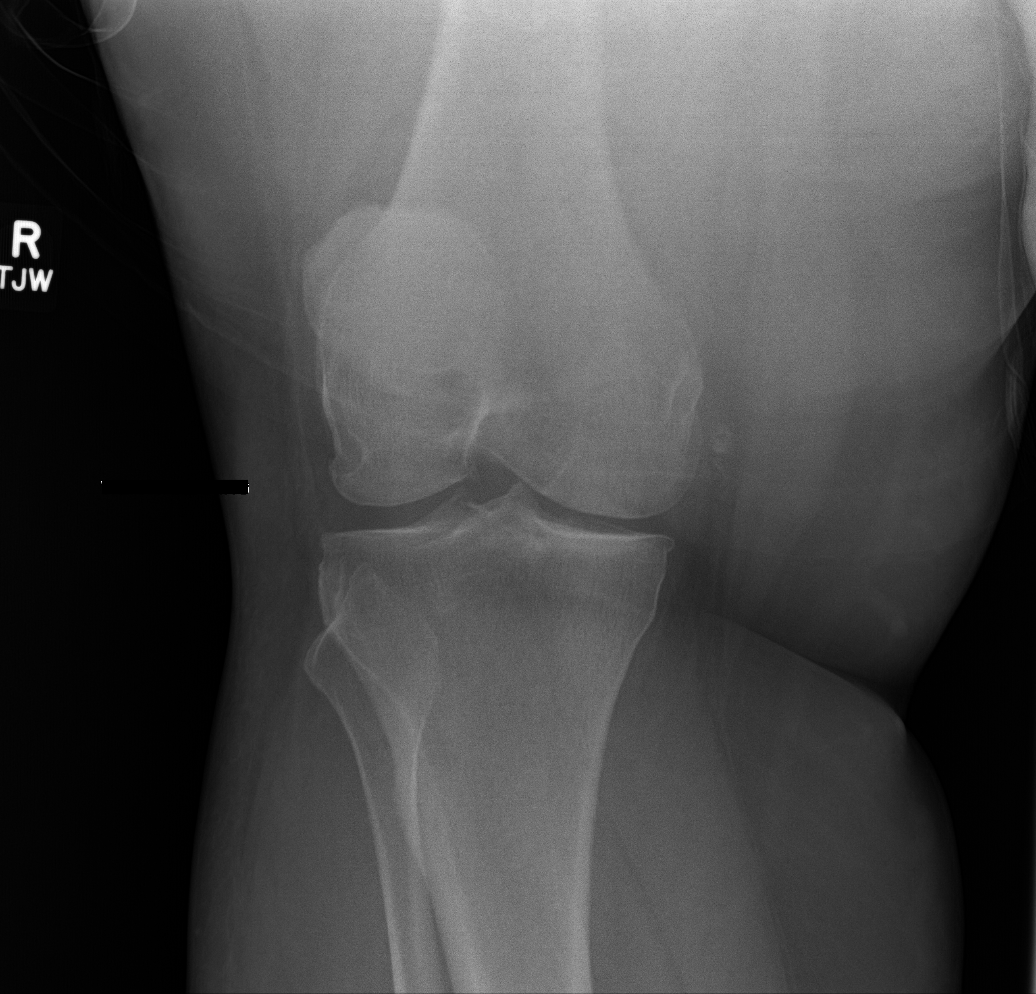

[knee tunnel]
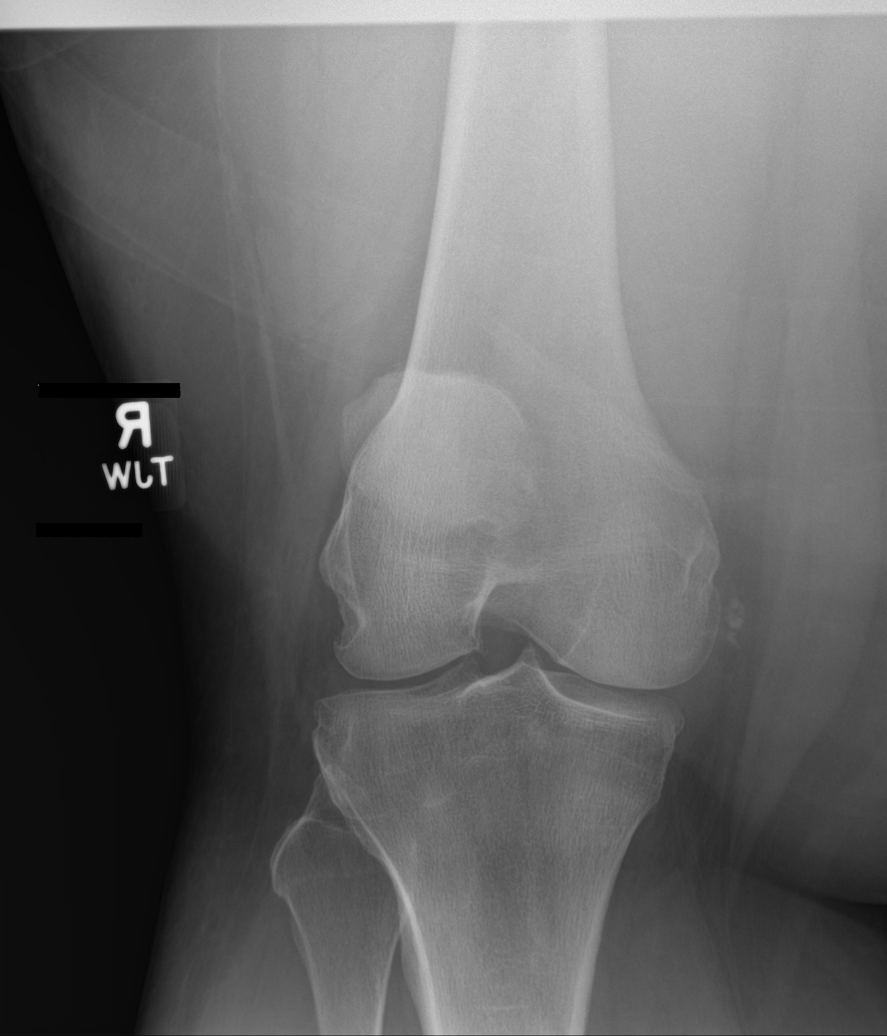

[knee lat]
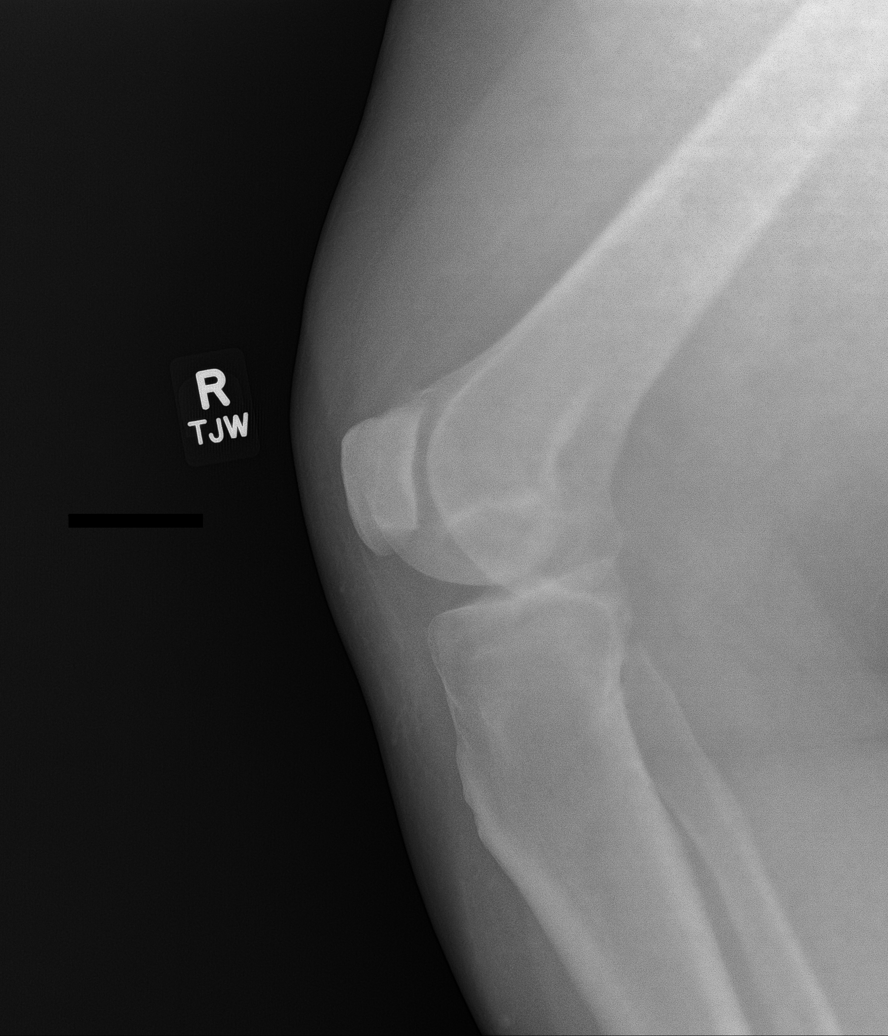

[patella skyline]
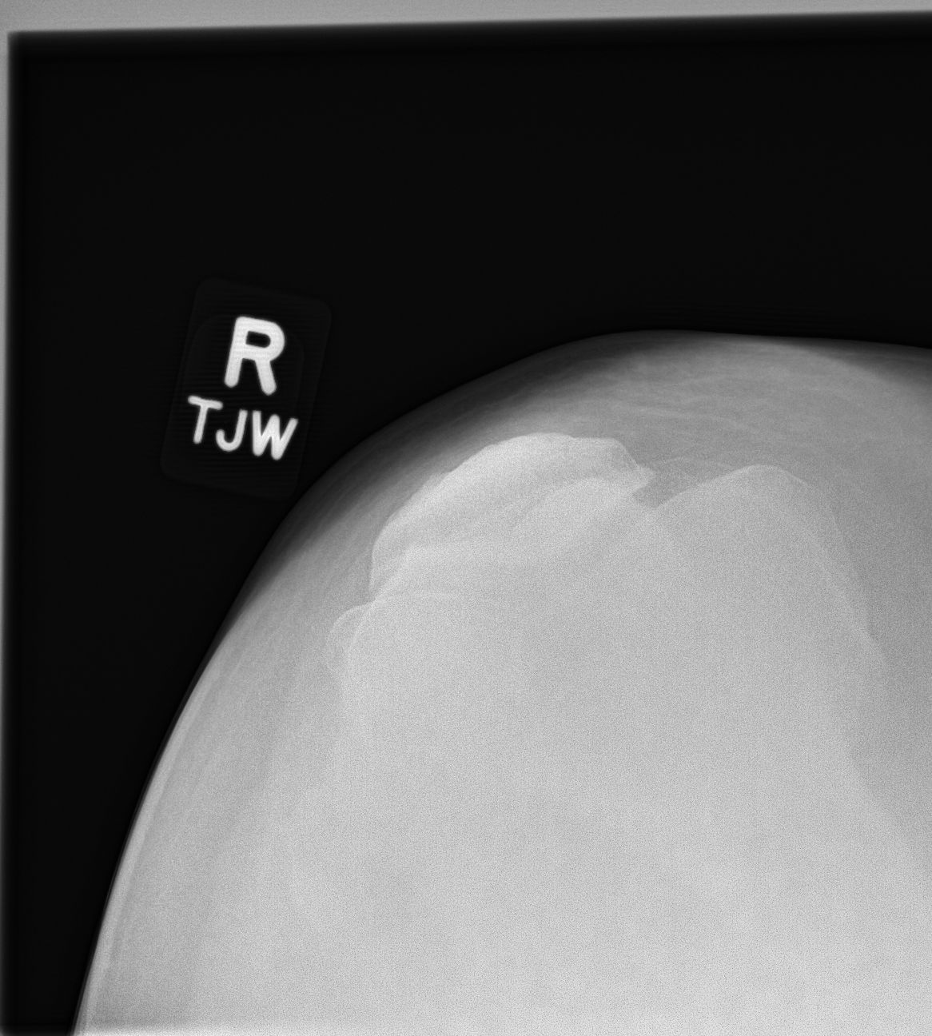

[knee ap (2 of 2)]
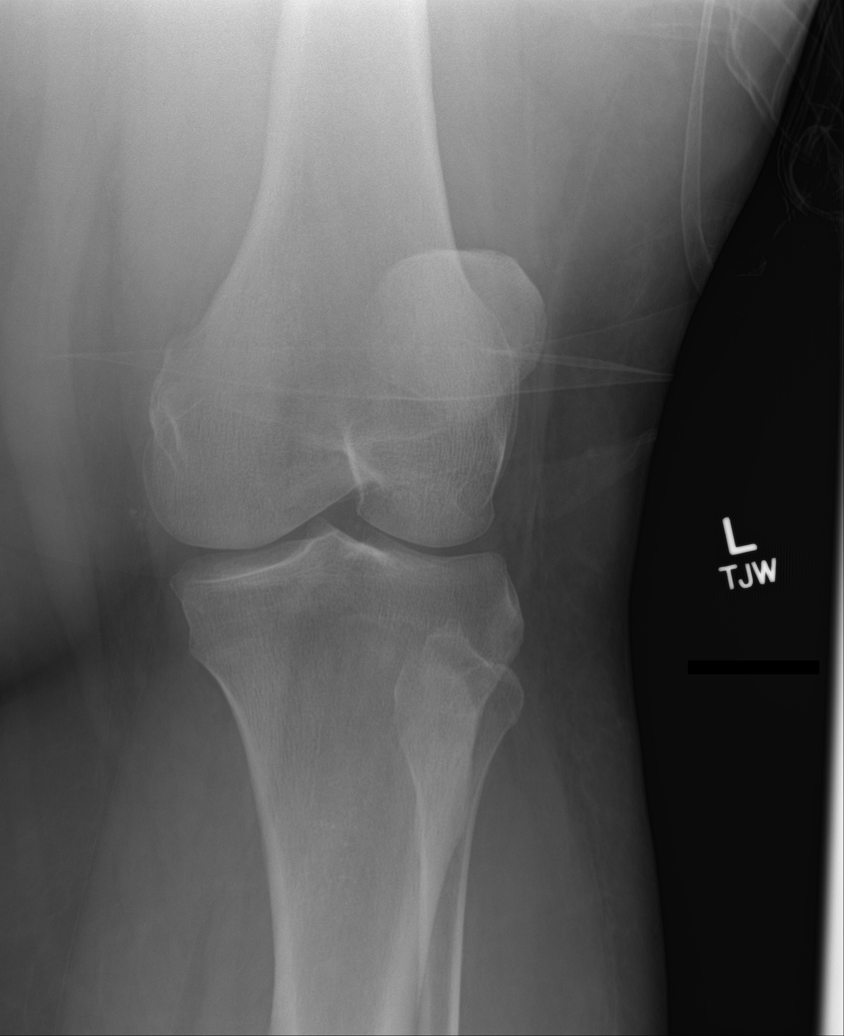

[5 of 5 positions shown; findings below may reference images not displayed]

FINDINGS: There is no fracture or dislocation or appreciable joint effusion.
There is slight medial joint space narrowing. Soft tissue
calcifications adjacent to the medial femoral condyle could
represent remote injury to the medial collateral ligament. Small
marginal osteophytes on the patella.

The AP view of the left knee demonstrates slight lateral joint space
narrowing and similar but less prominent calcifications adjacent to
the medial femoral condyle.
IMPRESSION: No acute abnormality. Slight arthritic changes of the right knee.
Soft tissue calcifications adjacent to the medial femoral condyle
could represent remote injury to the medial collateral ligament.

## 2021-11-29 NOTE — Progress Notes (Signed)
Reviewed and agree with assessment/plan.   Chesley Mires, MD [facility] Pulmonary/Critical Care 11/29/2021, 1:47 PM Pager:  [phone]

## 2022-02-25 ENCOUNTER — Ambulatory Visit (INDEPENDENT_AMBULATORY_CARE_PROVIDER_SITE_OTHER): Payer: 59 | Admitting: Adult Health

## 2022-02-25 ENCOUNTER — Encounter: Payer: Self-pay | Admitting: Adult Health

## 2022-02-25 DIAGNOSIS — G4733 Obstructive sleep apnea (adult) (pediatric): Secondary | ICD-10-CM

## 2022-02-25 NOTE — Patient Instructions (Signed)
Continue on CPAP at bedtime and with naps Goal is to wear your CPAP all night long, for at least 6 hours or more Healthy sleep regimen Do not drive if sleepy Work on healthy weight loss Follow-up in 6 months and as needed

## 2022-02-25 NOTE — Progress Notes (Signed)
$'@Patient'l$  ID: Timothy Delacruz, male    DOB: 02/22/1971, 51 y.o.   MRN: [id number]  Chief Complaint  Patient presents with   Follow-up    Referring provider: Pleas Koch, NP  HPI: 51 year old male seen for sleep consult September 15, 2021 found to have very severe sleep apnea Medical history significant for morbid obesity with current weight at 459, BMI 64  TEST/EVENTS :  Split night  Nov 10, 2021 showed very severe sleep apnea with a AHI at 80.1/hour, AHI while supine 111/hour, (no CPAP titration was done)  02/25/2022 Follow up : OSA  Patient returns for a 5-monthfollow-up.  Patient was seen in March of this year for sleep consult.  He had snoring, daytime sleepiness and restless sleep.  He was set up for a split-night sleep study that showed very severe sleep apnea with AHI at 80.1/hour.  Patient was started on CPAP.  Patient says he is feeling much better since starting on CPAP.  He has significantly decreased daytime sleepiness and feels that he benefits from CPAP.  CPAP download shows excellent compliance with 100% usage.  Daily average usage at 7.5 hours.  Patient is on auto CPAP 5 to 15 cm H2O.  AHI is 1.9/hour.  Daily average pressure at 14.6 cm H2O. Patient is using a full facemask.  Uses [employer] for his DME. Says he is even noticed that his leg swelling has been less since being on CPAP.  Allergies  Allergen Reactions   Lisinopril     REACTION: cough    Immunization History  Administered Date(s) Administered   Influenza,inj,Quad PF,6+ Mos 06/30/2021   Influenza-Unspecified 04/08/2019   PFIZER(Purple Top)SARS-COV-2 Vaccination 09/13/2019, 10/08/2019   Td 06/09/2009   Tdap 06/13/2019   Zoster Recombinat (Shingrix) 04/08/2019, 06/13/2019    Past Medical History:  Diagnosis Date   Hyperlipidemia    Hypertension    Loss or death of partner 910-24-14  Low testosterone 03/22/2013    Tobacco History: Social History   Tobacco Use  Smoking Status Never   Smokeless Tobacco Never   Counseling given: Not Answered   Outpatient Medications Prior to Visit  Medication Sig Dispense Refill   calcium citrate-vitamin D 200-200 MG-UNIT TABS Take 4 tablets by mouth daily.       diclofenac Sodium (VOLTAREN) 1 % GEL Apply 2 g topically 4 (four) times daily.     hydrochlorothiazide (HYDRODIURIL) 25 MG tablet Take 1 tablet (25 mg total) by mouth daily. For blood pressure. 90 tablet 3   Multiple Vitamins-Minerals (MULTIVITAMIN WITH MINERALS) tablet Take 1 tablet by mouth daily.       No facility-administered medications prior to visit.     Review of Systems:   Constitutional:   No  weight loss, night sweats,  Fevers, chills, fatigue, or  lassitude.  HEENT:   No headaches,  Difficulty swallowing,  Tooth/dental problems, or  Sore throat,                No sneezing, itching, ear ache, nasal congestion, post nasal drip,   CV:  No chest pain,  Orthopnea, PND, swelling in lower extremities, anasarca, dizziness, palpitations, syncope.   GI  No heartburn, indigestion, abdominal pain, nausea, vomiting, diarrhea, change in bowel habits, loss of appetite, bloody stools.   Resp: No shortness of breath with exertion or at rest.  No excess mucus, no productive cough,  No non-productive cough,  No coughing up of blood.  No change in color of mucus.  No wheezing.  No chest wall deformity  Skin: no rash or lesions.  GU: no dysuria, change in color of urine, no urgency or frequency.  No flank pain, no hematuria   MS:  No joint pain or swelling.  No decreased range of motion.  No back pain.    Physical Exam  BP 130/80 (BP Location: Left Arm, Patient Position: Sitting, Cuff Size: Large)   Pulse 98   Temp 97.9 F (36.6 C) (Oral)   Ht '5\' 11"'$  (1.803 m)   Wt (!) 459 lb 6.4 oz (208.4 kg)   SpO2 98%   BMI 64.07 kg/m   GEN: A/Ox3; pleasant , NAD, well nourished    HEENT:  [place]/AT,  , NOSE-clear, THROAT-clear, no lesions, no postnasal drip or exudate noted.   Class II-III MP airway, elongated uvula  NECK:  Supple w/ fair ROM; no JVD; normal carotid impulses w/o bruits; no thyromegaly or nodules palpated; no lymphadenopathy.    RESP  Clear  P & A; w/o, wheezes/ rales/ or rhonchi. no accessory muscle use, no dullness to percussion  CARD:  RRR, no m/r/g, trace to 1+ peripheral edema, stasis dermatitis changes, pulses intact, no cyanosis or clubbing.  GI:   Soft & nt; nml bowel sounds; no organomegaly or masses detected.   Musco: Warm bil, no deformities or joint swelling noted.   Neuro: alert, no focal deficits noted.    Skin: Warm, no lesions or rashes    Lab Results:    BNP No results found for: "BNP"  ProBNP No results found for: "PROBNP"  Imaging: No results found.        No data to display          No results found for: "NITRICOXIDE"      Assessment & Plan:   OSA (obstructive sleep apnea) Severe sleep apnea with excellent control and compliance on CPAP.  Patient education given We will continue current settings.  Plan  Patient Instructions  Continue on CPAP at bedtime and with naps Goal is to wear your CPAP all night long, for at least 6 hours or more Healthy sleep regimen Do not drive if sleepy Work on healthy weight loss Follow-up in 6 months and as needed      OBESITY, MORBID Healthy weight loss discussed     Rexene Edison, NP 02/25/2022

## 2022-02-25 NOTE — Assessment & Plan Note (Signed)
Healthy weight loss discussed 

## 2022-02-25 NOTE — Assessment & Plan Note (Signed)
Severe sleep apnea with excellent control and compliance on CPAP.  Patient education given We will continue current settings.  Plan  Patient Instructions  Continue on CPAP at bedtime and with naps Goal is to wear your CPAP all night long, for at least 6 hours or more Healthy sleep regimen Do not drive if sleepy Work on healthy weight loss Follow-up in 6 months and as needed

## 2022-04-20 ENCOUNTER — Other Ambulatory Visit: Payer: Self-pay | Admitting: Primary Care

## 2022-04-20 DIAGNOSIS — I10 Essential (primary) hypertension: Secondary | ICD-10-CM

## 2022-06-28 ENCOUNTER — Telehealth: Payer: Self-pay | Admitting: Primary Care

## 2022-06-28 DIAGNOSIS — I10 Essential (primary) hypertension: Secondary | ICD-10-CM

## 2022-06-28 MED ORDER — HYDROCHLOROTHIAZIDE 25 MG PO TABS: 25.0000 mg | ORAL_TABLET | Freq: Every day | ORAL | 0 refills | Status: DC

## 2022-06-28 NOTE — Telephone Encounter (Signed)
  Encourage patient to contact the pharmacy for refills or they can request refills through [facility]  Did the patient contact the pharmacy: no    LAST APPOINTMENT DATE:  Please schedule appointment if longer than 1 year  NEXT APPOINTMENT DATE:07/06/2022  MEDICATION:hydrochlorothiazide hydrochlorothiazide (HYDRODIURIL) 25 MG tablet  Is the patient out of medication? no  If not, how much is left?2 weeks left  Is this a 90 day supply: yes  [id number] [place] [phone] Fax: [phone]     Let patient know to contact pharmacy at the end of the day to make sure medication is ready.  Please notify patient to allow 48-72 hours to process

## 2022-07-06 ENCOUNTER — Ambulatory Visit (INDEPENDENT_AMBULATORY_CARE_PROVIDER_SITE_OTHER): Payer: 59 | Admitting: Primary Care

## 2022-07-06 ENCOUNTER — Encounter: Payer: Self-pay | Admitting: Primary Care

## 2022-07-06 VITALS — BP 138/84 | HR 72 | Temp 97.9°F | Ht 71.0 in | Wt >= 6400 oz

## 2022-07-06 DIAGNOSIS — G4733 Obstructive sleep apnea (adult) (pediatric): Secondary | ICD-10-CM

## 2022-07-06 DIAGNOSIS — E785 Hyperlipidemia, unspecified: Secondary | ICD-10-CM

## 2022-07-06 DIAGNOSIS — M48061 Spinal stenosis, lumbar region without neurogenic claudication: Secondary | ICD-10-CM | POA: Diagnosis not present

## 2022-07-06 DIAGNOSIS — Z0001 Encounter for general adult medical examination with abnormal findings: Secondary | ICD-10-CM | POA: Diagnosis not present

## 2022-07-06 DIAGNOSIS — Z7251 High risk heterosexual behavior: Secondary | ICD-10-CM | POA: Insufficient documentation

## 2022-07-06 DIAGNOSIS — R7303 Prediabetes: Secondary | ICD-10-CM | POA: Insufficient documentation

## 2022-07-06 DIAGNOSIS — Z23 Encounter for immunization: Secondary | ICD-10-CM | POA: Diagnosis not present

## 2022-07-06 DIAGNOSIS — I10 Essential (primary) hypertension: Secondary | ICD-10-CM | POA: Diagnosis not present

## 2022-07-06 DIAGNOSIS — Z125 Encounter for screening for malignant neoplasm of prostate: Secondary | ICD-10-CM | POA: Diagnosis not present

## 2022-07-06 DIAGNOSIS — Z113 Encounter for screening for infections with a predominantly sexual mode of transmission: Secondary | ICD-10-CM

## 2022-07-06 LAB — COMPREHENSIVE METABOLIC PANEL
ALT: 25 U/L (ref 0–53)
AST: 24 U/L (ref 0–37)
Albumin: 4.1 g/dL (ref 3.5–5.2)
Alkaline Phosphatase: 53 U/L (ref 39–117)
BUN: 11 mg/dL (ref 6–23)
CO2: 27 mEq/L (ref 19–32)
Calcium: 9.3 mg/dL (ref 8.4–10.5)
Chloride: 100 mEq/L (ref 96–112)
Creatinine, Ser: 0.71 mg/dL (ref 0.40–1.50)
GFR: 104.37 mL/min (ref >60.00)
Glucose, Bld: 96 mg/dL (ref 70–99)
Potassium: 3.9 mEq/L (ref 3.5–5.1)
Sodium: 138 mEq/L (ref 135–145)
Total Bilirubin: 0.6 mg/dL (ref 0.2–1.2)
Total Protein: 7.8 g/dL (ref 6.0–8.3)

## 2022-07-06 LAB — HEMOGLOBIN A1C: Hgb A1c MFr Bld: 6.2 % (ref 4.6–6.5)

## 2022-07-06 LAB — PSA: PSA: 0.88 ng/mL (ref 0.10–4.00)

## 2022-07-06 LAB — LIPID PANEL
Cholesterol: 197 mg/dL (ref 0–200)
HDL: 42.7 mg/dL (ref >39.00)
LDL Cholesterol: 123 mg/dL — ABNORMAL HIGH (ref 0–99)
NonHDL: 154.01
Total CHOL/HDL Ratio: 5
Triglycerides: 157 mg/dL — ABNORMAL HIGH (ref 0.0–149.0)
VLDL: 31.4 mg/dL (ref 0.0–40.0)

## 2022-07-06 NOTE — Assessment & Plan Note (Signed)
Referral placed to infectious disease for evaluation of PREP. STD testing ordered and pending.

## 2022-07-06 NOTE — Assessment & Plan Note (Signed)
Discussed the importance of a healthy diet and regular exercise in order for weight loss, and to reduce the risk of further co-morbidity.

## 2022-07-06 NOTE — Assessment & Plan Note (Signed)
Deteriorated.  Suspect bilateral anterior thigh pain is secondary. Reviewed MRI lumbar spine from 2019.  Referral placed for physical therapy. Consider spine specialist.  Discussed need for weight loss today.

## 2022-07-06 NOTE — Assessment & Plan Note (Signed)
Repeat lipid panel pending. 

## 2022-07-06 NOTE — Assessment & Plan Note (Signed)
Following with pulmonology, office notes reviewed from September 2023.  Continue CPAP nightly.

## 2022-07-06 NOTE — Assessment & Plan Note (Signed)
Repeat A1C pending.  Discussed the importance of a healthy diet and regular exercise in order for weight loss, and to reduce the risk of further co-morbidity.  

## 2022-07-06 NOTE — Assessment & Plan Note (Signed)
Immunizations UTD. Influenza vaccine provided today. PSA due and pending. Colonoscopy UTD, due 2028  Discussed the importance of a healthy diet and regular exercise in order for weight loss, and to reduce the risk of further co-morbidity.  Exam stable. Labs pending.  Follow up in 1 year for repeat physical.

## 2022-07-06 NOTE — Assessment & Plan Note (Signed)
Above goal today, improved on recheck. Continue HCTZ 25 mg for now.  CMP pending.

## 2022-07-06 NOTE — Patient Instructions (Signed)
Stop by the lab prior to leaving today. I will notify you of your results once received.   You will either be contacted via phone regarding your referral to physical therapy and for infectious disease, or you may receive a letter on your MyChart portal from our referral team with instructions for scheduling an appointment. Please let us know if you have not been contacted by anyone within two weeks.  It was a pleasure to see you today!  Preventive Care 31-2 Years Old, Male Preventive care refers to lifestyle choices and visits with your health care provider that can promote health and wellness. Preventive care visits are also called wellness exams. What can I expect for my preventive care visit? Counseling During your preventive care visit, your health care provider may ask about your: Medical history, including: Past medical problems. Family medical history. Current health, including: Emotional well-being. Home life and relationship well-being. Sexual activity. Lifestyle, including: Alcohol, nicotine or tobacco, and drug use. Access to firearms. Diet, exercise, and sleep habits. Safety issues such as seatbelt and bike helmet use. Sunscreen use. Work and work Statistician. Physical exam Your health care provider will check your: Height and weight. These may be used to calculate your BMI (body mass index). BMI is a measurement that tells if you are at a healthy weight. Waist circumference. This measures the distance around your waistline. This measurement also tells if you are at a healthy weight and may help predict your risk of certain diseases, such as type 2 diabetes and high blood pressure. Heart rate and blood pressure. Body temperature. Skin for abnormal spots. What immunizations do I need?  Vaccines are usually given at various ages, according to a schedule. Your health care provider will recommend vaccines for you based on your age, medical history, and lifestyle or other  factors, such as travel or where you work. What tests do I need? Screening Your health care provider may recommend screening tests for certain conditions. This may include: Lipid and cholesterol levels. Diabetes screening. This is done by checking your blood sugar (glucose) after you have not eaten for a while (fasting). Hepatitis B test. Hepatitis C test. HIV (human immunodeficiency virus) test. STI (sexually transmitted infection) testing, if you are at risk. Lung cancer screening. Prostate cancer screening. Colorectal cancer screening. Talk with your health care provider about your test results, treatment options, and if necessary, the need for more tests. Follow these instructions at home: Eating and drinking  Eat a diet that includes fresh fruits and vegetables, whole grains, lean protein, and low-fat dairy products. Take vitamin and mineral supplements as recommended by your health care provider. Do not drink alcohol if your health care provider tells you not to drink. If you drink alcohol: Limit how much you have to 0-2 drinks a day. Know how much alcohol is in your drink. In the [place] one drink equals one 12 oz bottle of beer (355 mL), one 5 oz glass of wine (148 mL), or one 1 oz glass of hard liquor (44 mL). Lifestyle Brush your teeth every morning and night with fluoride toothpaste. Floss one time each day. Exercise for at least 30 minutes 5 or more days each week. Do not use any products that contain nicotine or tobacco. These products include cigarettes, chewing tobacco, and vaping devices, such as e-cigarettes. If you need help quitting, ask your health care provider. Do not use drugs. If you are sexually active, practice safe sex. Use a condom or other form of protection  to prevent STIs. Take aspirin only as told by your health care provider. Make sure that you understand how much to take and what form to take. Work with your health care provider to find out whether it is  safe and beneficial for you to take aspirin daily. Find healthy ways to manage stress, such as: Meditation, yoga, or listening to music. Journaling. Talking to a trusted person. Spending time with friends and family. Minimize exposure to UV radiation to reduce your risk of skin cancer. Safety Always wear your seat belt while driving or riding in a vehicle. Do not drive: If you have been drinking alcohol. Do not ride with someone who has been drinking. When you are tired or distracted. While texting. If you have been using any mind-altering substances or drugs. Wear a helmet and other protective equipment during sports activities. If you have firearms in your house, make sure you follow all gun safety procedures. What's next? Go to your health care provider once a year for an annual wellness visit. Ask your health care provider how often you should have your eyes and teeth checked. Stay up to date on all vaccines. This information is not intended to replace advice given to you by your health care provider. Make sure you discuss any questions you have with your health care provider. Document Revised: 12/09/2020 Document Reviewed: 12/09/2020 Elsevier Patient Education  [place].

## 2022-07-06 NOTE — Progress Notes (Signed)
Subjective:    Patient ID: Timothy Delacruz, male    DOB: [birth year], 52 y.o.   MRN: [id number]  HPI  Timothy Delacruz is a very pleasant 52 y.o. male  has a past medical history of Acute knee pain (10/22/2019), Hyperlipidemia, Hypertension, Loss or death of partner (2013-04-19), and Low testosterone (03/22/2013). who presents today for complete physical and follow up of chronic conditions.  He is also requesting prescription for PREP. He is involved with a partner who is HIV positive but undetectable. He wears protection sometimes.   He's been checking his BP at home which is running in the 150's/90's. He's been under a lot of stress at home as he is caring for his mother who is living with him currently.   He does notice intermittent bilateral upper thigh pain with ambulation, everything feels feels heavier, improves with rest and stretching. He does have a history of chronic lumbar back pain with intermittent sciatica to right lateral thigh. 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 He completed physical therapy in 2019 which helped somewhat with lumbar back pain.   He plans on returning to the [facility] for water aerobics.   Immunizations: -Tetanus: Completed in 2020 -Influenza: Completed today -Shingles: Completed Shingrix series  Diet: Fair diet.  Exercise: No regular exercise.  Eye exam: Completes annually  Dental exam: Completes semi-annually   Colonoscopy: Completed in 2021, due 2028  PSA: Due  BP Readings from Last 3 Encounters:  07/06/22 138/84  02/25/22 130/80  11/25/21 106/80    Wt Readings from Last 3 Encounters:  07/06/22 (!) 458 lb (207.7 kg)  02/25/22 (!) 459 lb 6.4 oz (208.4 kg)  11/25/21 (!) 460 lb (208.7 kg)        Review of Systems  Constitutional:  Negative for unexpected weight change.  HENT:  Negative for rhinorrhea.   Respiratory:  Negative for cough and shortness of breath.    Cardiovascular:  Negative for chest pain.  Gastrointestinal:  Negative for constipation and diarrhea.  Genitourinary:  Negative for difficulty urinating.  Musculoskeletal:  Positive for arthralgias, back pain and myalgias.  Skin:  Negative for rash.  Allergic/Immunologic: Negative for environmental allergies.  Neurological:  Negative for dizziness, numbness and headaches.  Psychiatric/Behavioral:  The patient is not nervous/anxious.          Past Medical History:  Diagnosis Date   Acute knee pain 10/22/2019   Hyperlipidemia    Hypertension    Loss or death of partner 04-19-2013   Low testosterone 03/22/2013    Social History   Socioeconomic History   Marital status: Single    Spouse name: Not on file   Number of children: Not on file   Years of education: Not on file   Highest education level: Not on file  Occupational History   Not on file  Tobacco Use   Smoking status: Never   Smokeless tobacco: Never  Vaping Use   Vaping Use: Never used  Substance and Sexual Activity   Alcohol use: Yes    Comment: occ   Drug use: No   Sexual activity: Not on file  Other Topics Concern   Not on file  Social History Narrative   Not on file   Social Determinants of Health   Financial Resource Strain: Not on file  Food Insecurity: Not on file  Transportation Needs: Not on file  Physical Activity: Not on file  Stress: Not on file  Social Connections: Not on file  Intimate Partner Violence: Not on file    Past Surgical History:  Procedure Laterality Date   COLONOSCOPY WITH PROPOFOL N/A 07/19/2019   Procedure: COLONOSCOPY WITH PROPOFOL;  Surgeon: Jonathon Bellows, MD;  Location: [facility] ENDOSCOPY;  Service: Gastroenterology;  Laterality: N/A;   LAPAROSCOPIC GASTRIC BANDING  02/2011    Family History  Problem Relation Age of Onset   Cancer Father        prostate   Heart attack Paternal Grandmother    Cancer Paternal Grandfather        Cancer   Cancer Paternal Aunt         lung    Allergies  Allergen Reactions   Lisinopril     REACTION: cough    Current Outpatient Medications on File Prior to Visit  Medication Sig Dispense Refill   calcium citrate-vitamin D 200-200 MG-UNIT TABS Take 4 tablets by mouth daily.       diclofenac Sodium (VOLTAREN) 1 % GEL Apply 2 g topically 4 (four) times daily.     hydrochlorothiazide (HYDRODIURIL) 25 MG tablet Take 1 tablet (25 mg total) by mouth daily. For blood pressure. 90 tablet 0   magnesium 30 MG tablet Take 30 mg by mouth 2 (two) times daily.     Multiple Vitamins-Minerals (MULTIVITAMIN WITH MINERALS) tablet Take 1 tablet by mouth daily.       POTASSIUM PO Take by mouth.     No current facility-administered medications on file prior to visit.    BP 138/84   Pulse 72   Temp 97.9 F (36.6 C) (Temporal)   Ht '5\' 11"'$  (1.803 m)   Wt (!) 458 lb (207.7 kg)   SpO2 99%   BMI 63.88 kg/m  Objective:   Physical Exam HENT:     Right Ear: Tympanic membrane and ear canal normal.     Left Ear: Tympanic membrane and ear canal normal.     Nose: Nose normal.     Right Sinus: No maxillary sinus tenderness or frontal sinus tenderness.     Left Sinus: No maxillary sinus tenderness or frontal sinus tenderness.  Eyes:     Conjunctiva/sclera: Conjunctivae normal.  Neck:     Thyroid: No thyromegaly.     Vascular: No carotid bruit.  Cardiovascular:     Rate and Rhythm: Normal rate and regular rhythm.     Heart sounds: Normal heart sounds.  Pulmonary:     Effort: Pulmonary effort is normal.     Breath sounds: Normal breath sounds. No wheezing or rales.  Abdominal:     General: Bowel sounds are normal.     Palpations: Abdomen is soft.     Tenderness: There is no abdominal tenderness.  Musculoskeletal:        General: Normal range of motion.     Cervical back: Neck supple.  Skin:    General: Skin is warm and dry.  Neurological:     Mental Status: He is alert and oriented to person, place, and time.     Cranial Nerves:  No cranial nerve deficit.     Deep Tendon Reflexes: Reflexes are normal and symmetric.  Psychiatric:        Mood and Affect: Mood normal.           Assessment & Plan:  Encounter for annual general medical examination with abnormal findings in adult Assessment & Plan: Immunizations UTD. Influenza vaccine provided today. PSA due and pending. Colonoscopy  UTD, due 2028  Discussed the importance of a healthy diet and regular exercise in order for weight loss, and to reduce the risk of further co-morbidity.  Exam stable. Labs pending.  Follow up in 1 year for repeat physical.    Essential hypertension, benign Assessment & Plan: Above goal today, improved on recheck. Continue HCTZ 25 mg for now.  CMP pending.   Screening for prostate cancer -     PSA  Hyperlipidemia, unspecified hyperlipidemia type Assessment & Plan: Repeat lipid panel pending.  Orders: -     Lipid panel -     Comprehensive metabolic panel  Prediabetes Assessment & Plan: Repeat A1C pending.  Discussed the importance of a healthy diet and regular exercise in order for weight loss, and to reduce the risk of further co-morbidity.   Orders: -     Hemoglobin A1c  OSA (obstructive sleep apnea) Assessment & Plan: Following with pulmonology, office notes reviewed from September 2023.  Continue CPAP nightly.   High risk sexual behavior, unspecified type Assessment & Plan: Referral placed to infectious disease for evaluation of PREP. STD testing ordered and pending.  Orders: -     Ambulatory referral to Infectious Disease  Spinal stenosis of lumbar region, unspecified whether neurogenic claudication present Assessment & Plan: Deteriorated.  Suspect bilateral anterior thigh pain is secondary. Reviewed MRI lumbar spine from 2019.  Referral placed for physical therapy. Consider spine specialist.  Discussed need for weight loss today.  Orders: -     Ambulatory referral to Physical  Therapy  Screening for STD (sexually transmitted disease) -     RPR -     Hepatitis C antibody -     HIV Antibody (routine testing w rflx) -     Herpes Simplex Virus 1 and 2 (IgG), with Reflex to HSV-2 Inhibition  OBESITY, MORBID Assessment & Plan: Discussed the importance of a healthy diet and regular exercise in order for weight loss, and to reduce the risk of further co-morbidity.    Need for immunization against influenza -     Flu Vaccine QUAD 69moIM (Fluarix, Fluzone & Alfiuria Quad PF)        KPleas Koch NP

## 2022-07-08 LAB — HEPATITIS C ANTIBODY: Hepatitis C Ab: NONREACTIVE

## 2022-07-08 LAB — HIV ANTIBODY (ROUTINE TESTING W REFLEX): HIV 1&2 Ab, 4th Generation: NONREACTIVE

## 2022-07-08 LAB — HERPES SIMPLEX VIRUS 1 AND 2 (IGG),REFLEX HSV-2 INHIBITION
HAV 1 IGG,TYPE SPECIFIC AB: 53.1 index — ABNORMAL HIGH
HSV 2 IGG,TYPE SPECIFIC AB: 0.93 index — ABNORMAL HIGH

## 2022-07-08 LAB — RPR: RPR Ser Ql: NONREACTIVE

## 2022-07-12 ENCOUNTER — Encounter: Payer: Self-pay | Admitting: Internal Medicine

## 2022-07-12 ENCOUNTER — Ambulatory Visit (INDEPENDENT_AMBULATORY_CARE_PROVIDER_SITE_OTHER): Payer: 59 | Admitting: Internal Medicine

## 2022-07-12 VITALS — BP 138/84 | HR 75 | Temp 97.8°F | Ht 71.0 in | Wt >= 6400 oz

## 2022-07-12 DIAGNOSIS — J020 Streptococcal pharyngitis: Secondary | ICD-10-CM | POA: Insufficient documentation

## 2022-07-12 DIAGNOSIS — J029 Acute pharyngitis, unspecified: Secondary | ICD-10-CM | POA: Diagnosis not present

## 2022-07-12 LAB — POCT RAPID STREP A (OFFICE): Rapid Strep A Screen: POSITIVE — AB

## 2022-07-12 LAB — POC COVID19 BINAXNOW: SARS Coronavirus 2 Ag: NEGATIVE

## 2022-07-12 MED ORDER — PENICILLIN V POTASSIUM 500 MG PO TABS: 500.0000 mg | ORAL_TABLET | Freq: Three times a day (TID) | ORAL | 0 refills | Status: AC

## 2022-07-12 NOTE — Progress Notes (Signed)
Subjective:    Patient ID: Timothy Delacruz, male    DOB: Jan 14, 1971, 52 y.o.   MRN: [id number]  HPI Here due to a sore throat  Started with sore throat yesterday Feels it swollen on left side This morning--he feels like "a ball in the back of my throat" Swallows okay--but some pain No fever Does get more irritated talking on the phone--does phone work No cough or runny nose  Current Outpatient Medications on File Prior to Visit  Medication Sig Dispense Refill   calcium citrate-vitamin D 200-200 MG-UNIT TABS Take 4 tablets by mouth daily.       diclofenac Sodium (VOLTAREN) 1 % GEL Apply 2 g topically 4 (four) times daily.     hydrochlorothiazide (HYDRODIURIL) 25 MG tablet Take 1 tablet (25 mg total) by mouth daily. For blood pressure. 90 tablet 0   magnesium 30 MG tablet Take 30 mg by mouth 2 (two) times daily.     Multiple Vitamins-Minerals (MULTIVITAMIN WITH MINERALS) tablet Take 1 tablet by mouth daily.       POTASSIUM PO Take by mouth.     No current facility-administered medications on file prior to visit.    Allergies  Allergen Reactions   Lisinopril     REACTION: cough    Past Medical History:  Diagnosis Date   Acute knee pain 10/22/2019   Hyperlipidemia    Hypertension    Loss or death of partner April 05, 2013   Low testosterone 03/22/2013    Past Surgical History:  Procedure Laterality Date   COLONOSCOPY WITH PROPOFOL N/A 07/19/2019   Procedure: COLONOSCOPY WITH PROPOFOL;  Surgeon: Jonathon Bellows, MD;  Location: [facility] ENDOSCOPY;  Service: Gastroenterology;  Laterality: N/A;   LAPAROSCOPIC GASTRIC BANDING  02/2011    Family History  Problem Relation Age of Onset   Cancer Father        prostate   Heart attack Paternal Grandmother    Cancer Paternal Grandfather        Cancer   Cancer Paternal Aunt        lung    Social History   Socioeconomic History   Marital status: Single    Spouse name: Not on file   Number of children: Not on file   Years of  education: Not on file   Highest education level: Not on file  Occupational History   Not on file  Tobacco Use   Smoking status: Never   Smokeless tobacco: Never  Vaping Use   Vaping Use: Never used  Substance and Sexual Activity   Alcohol use: Yes    Comment: occ   Drug use: No   Sexual activity: Not on file  Other Topics Concern   Not on file  Social History Narrative   Not on file   Social Determinants of Health   Financial Resource Strain: Not on file  Food Insecurity: Not on file  Transportation Needs: Not on file  Physical Activity: Not on file  Stress: Not on file  Social Connections: Not on file  Intimate Partner Violence: Not on file   Review of Systems No N/V Slight aching across shoulders No rash No exposure to children--but did go to funeral about 5 days ago    Objective:   Physical Exam Constitutional:      Appearance: He is well-developed.  HENT:     Head:     Comments: No sinus tenderness    Right Ear: Tympanic membrane and ear canal normal.  Left Ear: Tympanic membrane and ear canal normal.     Mouth/Throat:     Comments: Mild pharyngeal injection No abscess or exudates Neck:     Comments: Mildly tender left anterior cervical nodes Pulmonary:     Effort: Pulmonary effort is normal.     Breath sounds: Normal breath sounds. No wheezing or rales.  Musculoskeletal:     Cervical back: Neck supple.  Neurological:     Mental Status: He is alert.            Assessment & Plan:

## 2022-07-12 NOTE — Assessment & Plan Note (Signed)
Discussed tylenol for pain (or NSAIDs briefly) Will treat with PCN veeK 500 tid x 10 days

## 2022-07-13 ENCOUNTER — Ambulatory Visit: Payer: 59

## 2022-07-14 ENCOUNTER — Ambulatory Visit: Payer: 59 | Attending: Infectious Diseases | Admitting: Infectious Diseases

## 2022-07-14 ENCOUNTER — Other Ambulatory Visit
Admission: RE | Admit: 2022-07-14 | Discharge: 2022-07-14 | Disposition: A | Discharge status: Home / Self Care | Payer: 59 | Source: Ambulatory Visit | Attending: Infectious Diseases | Admitting: Infectious Diseases

## 2022-07-14 ENCOUNTER — Encounter: Payer: Self-pay | Admitting: Infectious Diseases

## 2022-07-14 VITALS — BP 135/79 | HR 80 | Temp 97.9°F | Ht 71.0 in | Wt >= 6400 oz

## 2022-07-14 DIAGNOSIS — I10 Essential (primary) hypertension: Secondary | ICD-10-CM | POA: Diagnosis not present

## 2022-07-14 DIAGNOSIS — Z9189 Other specified personal risk factors, not elsewhere classified: Secondary | ICD-10-CM

## 2022-07-14 DIAGNOSIS — E785 Hyperlipidemia, unspecified: Secondary | ICD-10-CM | POA: Insufficient documentation

## 2022-07-14 DIAGNOSIS — Z2981 Encounter for HIV pre-exposure prophylaxis: Secondary | ICD-10-CM

## 2022-07-14 DIAGNOSIS — Z206 Contact with and (suspected) exposure to human immunodeficiency virus [HIV]: Secondary | ICD-10-CM | POA: Insufficient documentation

## 2022-07-14 LAB — HEPATITIS B CORE ANTIBODY, TOTAL: Hep B Core Total Ab: NONREACTIVE

## 2022-07-14 LAB — HEPATITIS B SURFACE ANTIGEN: Hepatitis B Surface Ag: NONREACTIVE

## 2022-07-14 LAB — CHLAMYDIA/NGC RT PCR (ARMC ONLY)
Chlamydia Tr: NOT DETECTED
Chlamydia Tr: NOT DETECTED
N gonorrhoeae: NOT DETECTED
N gonorrhoeae: NOT DETECTED

## 2022-07-14 LAB — HEPATITIS A ANTIBODY, TOTAL: hep A Total Ab: NONREACTIVE

## 2022-07-14 MED ORDER — DESCOVY 120-15 MG PO TABS: 1.0000 | ORAL_TABLET | Freq: Every day | ORAL | 2 refills | Status: DC

## 2022-07-14 MED ORDER — DOXYCYCLINE MONOHYDRATE 100 MG PO TABS: 200.0000 mg | ORAL_TABLET | Freq: Once | ORAL | 0 refills | Status: DC | PRN

## 2022-07-14 NOTE — Progress Notes (Signed)
ID PREP Visit NAME: Timothy Delacruz  DOB: [birth year]  MRN: [id number]  Date/Time: 07/14/2022 9:59 AM Subjective:  REASON FOR CONSULT:  PreP visit Timothy Delacruz is a 52 y.o. with a history of HTN, Hyperlipidemia Here to discuss PreP Risk-MSM Partners in the past 6 months- 3 Unprotected anal/oral sex none in 3 months Illicit drug use- none Sex with HIV + individual- one person is undetectable Past Medical History:  Diagnosis Date   Acute knee pain 10/22/2019   Hyperlipidemia    Hypertension    Loss or death of partner 24-Mar-2013   Low testosterone 03/22/2013    Past Surgical History:  Procedure Laterality Date   COLONOSCOPY WITH PROPOFOL N/A 07/19/2019   Procedure: COLONOSCOPY WITH PROPOFOL;  Surgeon: Jonathon Bellows, MD;  Location: [facility] ENDOSCOPY;  Service: Gastroenterology;  Laterality: N/A;   LAPAROSCOPIC GASTRIC BANDING  02/2011    Social History   Socioeconomic History   Marital status: Single    Spouse name: Not on file   Number of children: Not on file   Years of education: Not on file   Highest education level: Not on file  Occupational History   Not on file  Tobacco Use   Smoking status: Never   Smokeless tobacco: Never  Vaping Use   Vaping Use: Never used  Substance and Sexual Activity   Alcohol use: Yes    Comment: occ   Drug use: No   Sexual activity: Not Currently    Partners: Male    Comment: no condoms  Other Topics Concern   Not on file  Social History Narrative   Not on file   Social Determinants of Health   Financial Resource Strain: Not on file  Food Insecurity: Not on file  Transportation Needs: Not on file  Physical Activity: Not on file  Stress: Not on file  Social Connections: Not on file  Intimate Partner Violence: Not on file    Family History  Problem Relation Age of Onset   Cancer Father        prostate   Heart attack Paternal Grandmother    Cancer Paternal Grandfather        Cancer   Cancer Paternal Aunt        lung    Allergies  Allergen Reactions   Lisinopril     REACTION: cough   ? Current Outpatient Medications  Medication Sig Dispense Refill   calcium citrate-vitamin D 200-200 MG-UNIT TABS Take 4 tablets by mouth daily.       diclofenac Sodium (VOLTAREN) 1 % GEL Apply 2 g topically 4 (four) times daily.     hydrochlorothiazide (HYDRODIURIL) 25 MG tablet Take 1 tablet (25 mg total) by mouth daily. For blood pressure. 90 tablet 0   magnesium 30 MG tablet Take 30 mg by mouth 2 (two) times daily.     Multiple Vitamins-Minerals (MULTIVITAMIN WITH MINERALS) tablet Take 1 tablet by mouth daily.       penicillin v potassium (VEETID) 500 MG tablet Take 1 tablet (500 mg total) by mouth 3 (three) times daily for 10 days. 30 tablet 0   POTASSIUM PO Take by mouth.     No current facility-administered medications for this visit.    REVIEW OF SYSTEMS:  Const: negative fever, negative chills, negative weight loss Eyes: negative diplopia or visual changes, negative eye pain ENT: negative coryza, negative sore throat Resp: negative cough, hemoptysis, dyspnea Cards: negative for chest pain, palpitations, lower extremity edema GU: negative for frequency, dysuria  and hematuria Skin: negative for rash and pruritus Heme: negative for easy bruising and gum/nose bleeding MS: knee pain Neurolo:negative for headaches, dizziness, vertigo, memory problems  Psych: negative for feelings of anxiety, depression   ? Objective:  VITALS:  BP 135/79   Pulse 80   Temp 97.9 F (36.6 C) (Oral)   Ht '5\' 11"'$  (1.803 m)   Wt (!) 461 lb (209.1 kg)   SpO2 93%   BMI 64.30 kg/m  PHYSICAL EXAM:  General: Alert, cooperative, no distress, appears stated age. Increased BMI Head: Normocephalic, without obvious abnormality, atraumatic. Eyes: Conjunctivae clear, anicteric sclerae. Pupils are equal Nose: Nares normal. No drainage or sinus tenderness. Throat: Lips, mucosa, and tongue normal. No Thrush Neck: Supple, symmetrical, no  adenopathy, thyroid: non tender Lungs: Clear to auscultation bilaterally. No Wheezing or Rhonchi. No rales. Heart: Regular rate and rhythm, no murmur, rub or gallop. Abdomen: not examined Extremities: Extremities normal, atraumatic, no cyanosis. No edema. No clubbing Skin: limited examination Lymph: Cervical, supraclavicular normal. Neurologic: Grossly non-focal Pertinent Labs ? IMAGING RESULTS: Pre-Prescription  HIV- NR - 07/06/22 CMP0N HBV serology ( HBsag, S ab , core antibody) HAV HCV- NR RPR/ GC/CHL- sent today  Impression/Recommendation Here to intiate Pre exposure prophylaxis for HIV with truvada Discussed his risk factors, efficacy of PrEP, condoms to prevent other STDS, adherence, side effects of truvada , need to wait for 20 days after starting descovy  if he has to engage in insertive anal sex  Discussed how to recognize newly acquired HIv infection Baseline labs HIV-NR, HEP B immunity unknown , creatinine, LFTS N Will do  GC chl (Urine throat) HEP A, HEPB sent  Sent prescription for Descovy Also discussed Doxy PEP to prevent Stds like Chlamydia, syphilis and GC  Recent Strep A pharyngitis on penicillin and improving Follow up in 3 months  ?

## 2022-07-14 NOTE — Patient Instructions (Signed)
You are here for fPREP- HIV test is negative from 1/10. Will do HEPB screen and STD screen- sent Descovy 1 tablet a day to the pharmacy- also gave Doxy '100mg'$  tablet which you can take within 24-48 hrs of unprotected sex to prevent Chlamydia, gonorrhea and syphilis. The Descovy only protects against HIV Follow up 3 months

## 2022-07-15 ENCOUNTER — Ambulatory Visit: Payer: 59

## 2022-07-15 LAB — HEPATITIS B SURFACE ANTIBODY, QUANTITATIVE: Hep B S AB Quant (Post): 6.7 m[IU]/mL — ABNORMAL LOW (ref >9.9)

## 2022-07-16 ENCOUNTER — Encounter: Payer: Self-pay | Admitting: Infectious Diseases

## 2022-07-18 ENCOUNTER — Ambulatory Visit: Payer: 59

## 2022-07-18 ENCOUNTER — Other Ambulatory Visit: Payer: Self-pay

## 2022-07-18 ENCOUNTER — Telehealth: Payer: Self-pay

## 2022-07-18 MED ORDER — DESCOVY 120-15 MG PO TABS: 1.0000 | ORAL_TABLET | Freq: Every day | ORAL | 2 refills | Status: DC

## 2022-07-18 NOTE — Telephone Encounter (Signed)
-----  Message from Tsosie Billing, MD sent at 07/15/2022  6:48 PM EST ----- Please let the patient know on Monday that hepatitis B labs are good- looks like he was vaccinated before- may need a booster next time he comes to the clinic ----- Message ----- From: [place]: 07/14/2022  12:17 PM EST To: Tsosie Billing, MD

## 2022-07-19 ENCOUNTER — Ambulatory Visit: Payer: 59 | Attending: Primary Care

## 2022-07-19 DIAGNOSIS — M5386 Other specified dorsopathies, lumbar region: Secondary | ICD-10-CM

## 2022-07-19 DIAGNOSIS — M5459 Other low back pain: Secondary | ICD-10-CM | POA: Diagnosis present

## 2022-07-19 DIAGNOSIS — M545 Low back pain, unspecified: Secondary | ICD-10-CM | POA: Insufficient documentation

## 2022-07-19 DIAGNOSIS — M48061 Spinal stenosis, lumbar region without neurogenic claudication: Secondary | ICD-10-CM | POA: Diagnosis not present

## 2022-07-19 NOTE — Therapy (Signed)
OUTPATIENT PHYSICAL THERAPY THORACOLUMBAR EVALUATION   Patient Name: Timothy Delacruz MRN: [id number] DOB:Apr 26, 1971, 52 y.o., male Today's Date: 07/19/2022  END OF SESSION:  PT End of Session - 07/19/22 1014     Visit Number 1    Number of Visits 24    Date for PT Re-Evaluation 10/11/22    PT Start Time 2951    PT Stop Time 1100    PT Time Calculation (min) 45 min    Activity Tolerance Patient tolerated treatment well    Behavior During Therapy [facility] for tasks assessed/performed             Past Medical History:  Diagnosis Date   Acute knee pain 10/22/2019   Hyperlipidemia    Hypertension    Loss or death of partner March 27, 2013   Low testosterone 03/22/2013   Past Surgical History:  Procedure Laterality Date   COLONOSCOPY WITH PROPOFOL N/A 07/19/2019   Procedure: COLONOSCOPY WITH PROPOFOL;  Surgeon: Jonathon Bellows, MD;  Location: [facility] ENDOSCOPY;  Service: Gastroenterology;  Laterality: N/A;   LAPAROSCOPIC GASTRIC BANDING  02/2011   Patient Active Problem List   Diagnosis Date Noted   Strep throat 07/12/2022   Prediabetes 07/06/2022   High risk sexual behavior 07/06/2022   OSA (obstructive sleep apnea) 11/25/2021   Hypogonadism in male 09/30/2019   PVD (peripheral vascular disease) ([place]) 06/11/2019   Spinal stenosis 06/11/2019   Erectile dysfunction 03-27-13   Epidermal cyst 01/14/2013   Encounter for annual general medical examination with abnormal findings in adult 07/28/2011   APL Lapband 03/01/11 06/15/2011   Cutaneous skin tags 03/14/2011   Hyperlipidemia 09/08/2010   GYNECOMASTIA, UNILATERAL 08/04/2010   OBESITY, MORBID 06/09/2009   Essential hypertension, benign 06/09/2009   OTHER DYSPHAGIA 06/09/2009    PCP: Alma Friendly, NP  REFERRING PROVIDER: Alma Friendly, NP  REFERRING DIAG:  [phone] (ICD-10-CM) - Spinal stenosis of lumbar region, unspecified whether neurogenic claudication present   Rationale for Evaluation and Treatment:  Rehabilitation  THERAPY DIAG:  Spinal stenosis of lumbar region, unspecified whether neurogenic claudication present  ONSET DATE: ~1 year  SUBJECTIVE:                                                                                                                                                                                           SUBJECTIVE STATEMENT:  Pt reports he has been experiencing some increased pain in the lumbar spine and into both legs.  Pt has had experience with sciatica in the past.  Pt also has prior experience with PT and notes that the therapist gave him core exercises and told him that due to his  weight, he would need to work on things at home to resolve the issue and that was all that was done for the most part.  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  Pt notes pain with sitting for prolonged period of time or heaviness in the LE's when he is walking for a while.  The sciatica acts out the most when in bed.    PERTINENT HISTORY:   Pt   PAIN:  Are you having pain? No  PRECAUTIONS: None  WEIGHT BEARING RESTRICTIONS: No  FALLS:  Has patient fallen in last 6 months? No  LIVING ENVIRONMENT: Lives with: lives alone  but mother is staying with pt currently due to having a bad bout of Covid. Lives in: Mobile home Stairs:  Pt had a ramp installed due to his mother staying with him, so he does not have to go up any stairs. 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    NEXT MD VISIT: Does not currently have a date.  OBJECTIVE:   DIAGNOSTIC FINDINGS:   CLINICAL DATA:   Initial evaluation for lower back pain radiating into the bilateral hips and lower extremities with intermittent numbness at lateral aspect of right leg.   EXAM: MRI LUMBAR SPINE WITHOUT CONTRAST   TECHNIQUE: Multiplanar, multisequence MR imaging of the lumbar spine was performed. No intravenous contrast was administered.   COMPARISON:  Prior radiographs from 07/19/2017.   IMPRESSION: 1. Disc bulge with prominent facet hypertrophy at L2-3 with resultant moderate to severe spinal stenosis. 2. Disc bulge with facet hypertrophy at L3-4 with resultant mild to moderate bilateral L3 foraminal stenosis, left greater than right. 3. Shallow central disc protrusion at L4-5 without significant stenosis or neural impingement.  PATIENT SURVEYS:  Modified Oswestry 7  FOTO 60/68  SCREENING FOR RED FLAGS: Bowel or bladder incontinence: No Spinal tumors: No Cauda equina syndrome: No Compression fracture: No Abdominal aneurysm: No  COGNITION: Overall cognitive status: Within functional limits for tasks assessed     SENSATION: WFL, pt does have some numbness along the ITB.   POSTURE: rounded shoulders and forward head  LUMBAR ROM:   AROM eval  Flexion   Extension   Right lateral flexion   Left lateral flexion   Right rotation   Left rotation    (Blank rows = not tested)  LOWER EXTREMITY ROM:     LE ROM limited due to body habitus at this time.  LOWER EXTREMITY MMT:    MMT Right eval Left eval  Hip flexion 5 4  Hip abduction 5 5  Hip adduction 5 4  Knee flexion 5 4  Knee extension 5 5  Ankle dorsiflexion 5 5   (Blank rows = not tested)  LUMBAR SPECIAL TESTS:  Slump test: Negative and FABER test: Positive  FUNCTIONAL TESTS:  5 times sit to stand: 14.21 Timed up and go (TUG): 10.91 6 minute walk test: Defer to next visit  GAIT: Distance walked: 10' Assistive device utilized: None Level of assistance: Complete Independence Comments: Pt has abnormal gait  pattern throughout  the testing and will be monitored during subsequent visits.  TODAY'S TREATMENT: DATE: 07/19/22   EVAL Only.  PATIENT EDUCATION:  Education details: Pt educated on role of PT and services provided during current POC, along with prognosis and information about the clinic. Person educated: Patient Education method: Explanation Education comprehension: verbalized understanding  HOME EXERCISE PROGRAM:  Access Code: [email or web address] URL: [email or web address] Date: 07/19/2022 Prepared by: [place] Nation  Exercises - Supine Sciatic Nerve Glide  - 1 x daily - 7 x weekly - 3 sets - 10 reps - Standing Distal Sciatic Nerve Mobilization on Step  - 1 x daily - 7 x weekly - 3 sets - 10 reps - Standing Sciatic Nerve Mobilization on Step  - 1 x daily - 7 x weekly - 3 sets - 10 reps - Seated Sciatic Tensioner  - 1 x daily - 7 x weekly - 3 sets - 10 reps - Seated Slump Nerve Glide  - 1 x daily - 7 x weekly - 3 sets - 10 reps - Supine ITB Stretch with Strap  - 1 x daily - 7 x weekly - 3 sets - 10 reps - Standing ITB Stretch  - 1 x daily - 7 x weekly - 3 sets - 10 reps  ASSESSMENT:  CLINICAL IMPRESSION: Patient is a 52 y.o. male who was seen today for physical therapy evaluation and treatment for spinal stenosis. Pt presents with physical impairments of decreased activity tolerance, decreased ROM of lumbar spine and LE, increased pain in lumbar spine and B LE's, and decreased strength in L LE as noted.  Pt will benefit from skilled therapy to address tolerance, ROM, pain, and strength impairments necessary for improvement in quality of life.  Pt. demonstrates understanding of this plan of care and agrees with this plan.   OBJECTIVE IMPAIRMENTS: Abnormal gait, decreased activity tolerance, decreased endurance, decreased mobility, difficulty walking, decreased ROM, decreased strength, impaired flexibility, and obesity.   ACTIVITY LIMITATIONS: carrying, lifting, bending,  standing, squatting, sleeping, stairs, bed mobility, locomotion level, and caring for others  PARTICIPATION LIMITATIONS:  pt is able to do most things, but has difficulty with   PERSONAL FACTORS: Fitness, Past/current experiences, Profession, Time since onset of injury/illness/exacerbation, and 3+ comorbidities: obesity, spinal stenosis, PVD, HTN  are also affecting patient's functional outcome.   REHAB POTENTIAL: Fair , obesity and lack of mobility may have difficulty with exercises given.  Pt   CLINICAL DECISION MAKING: Stable/uncomplicated  EVALUATION COMPLEXITY: Moderate   GOALS: Goals reviewed with patient? Yes  SHORT TERM GOALS: Target date: 08/16/2022  Pt will be independent with HEP in order to demonstrate increased ability to perform tasks related to occupation/hobbies. Baseline: Given HEP at initial evaluation Goal status: INITIAL  LONG TERM GOALS: Target date: 10/11/2022  1.  Patient will complete five times sit to stand test in <10 seconds indicating an increased LE strength and improved balance. Baseline: 14.21 sec Goal status: INITIAL  2.  Patient will increase FOTO score to equal to or greater than 68 to demonstrate statistically significant improvement in mobility and quality of life.  Baseline: FOTO: 60 Goal status: INITIAL   3.  Patient will reduce timed up and go to <10 seconds to reduce fall risk and demonstrate improved transfer/gait ability. Baseline: 10.91 sec Goal status: INITIAL  4.  Patient will increase six minute walk test distance to >1000 for progression to community ambulator and improve gait ability Baseline: Deferred to next session Goal status: INITIAL    PLAN:  PT  FREQUENCY: 1x/week  PT DURATION: 12 weeks  PLANNED INTERVENTIONS: Therapeutic exercises, Therapeutic activity, Neuromuscular re-education, Balance training, Gait training, Patient/Family education, Self Care, Joint mobilization, Joint manipulation, Stair training, Vestibular  training, DME instructions, Aquatic Therapy, Dry Needling, Spinal manipulation, Spinal mobilization, Cryotherapy, Moist heat, Traction, and Manual therapy.  PLAN FOR NEXT SESSION: Assess 6MWT and check to see if pt has obtained information for [facility] and aquatic therapy.   Gwenlyn Saran, PT, DPT Physical Therapist- [facility]  07/19/22, 4:21 PM

## 2022-07-20 ENCOUNTER — Telehealth: Payer: Self-pay

## 2022-07-20 ENCOUNTER — Encounter: Payer: Self-pay | Admitting: Infectious Diseases

## 2022-07-20 ENCOUNTER — Other Ambulatory Visit (HOSPITAL_COMMUNITY): Payer: Self-pay

## 2022-07-20 NOTE — Telephone Encounter (Signed)
[facility] Patient Advocate Encounter   Was successful in obtaining a [employer] copay card for [place].  This copay card will make the patients copay $0.00.  I have spoken with the patient.          Ileene Patrick, [facility] Patient [facility] for Infectious Disease Phone: [phone] Fax:  [phone]

## 2022-07-22 ENCOUNTER — Ambulatory Visit: Payer: 59

## 2022-07-25 ENCOUNTER — Ambulatory Visit: Payer: 59

## 2022-07-25 DIAGNOSIS — M5386 Other specified dorsopathies, lumbar region: Secondary | ICD-10-CM

## 2022-07-25 DIAGNOSIS — M48061 Spinal stenosis, lumbar region without neurogenic claudication: Secondary | ICD-10-CM | POA: Diagnosis not present

## 2022-07-25 DIAGNOSIS — M5459 Other low back pain: Secondary | ICD-10-CM

## 2022-07-25 DIAGNOSIS — M545 Low back pain, unspecified: Secondary | ICD-10-CM

## 2022-07-25 NOTE — Therapy (Signed)
OUTPATIENT PHYSICAL THERAPY THORACOLUMBAR EVALUATION   Patient Name: Timothy Delacruz MRN: [id number] DOB:[birth year], 52 y.o., male Today's Date: 07/25/2022  END OF SESSION:  PT End of Session - 07/25/22 0943     Visit Number 2    Number of Visits 24    Date for PT Re-Evaluation 10/11/22    PT Start Time 0930    PT Stop Time 8657    PT Time Calculation (min) 45 min    Activity Tolerance Patient tolerated treatment well    Behavior During Therapy [facility] for tasks assessed/performed              Past Medical History:  Diagnosis Date   Acute knee pain 10/22/2019   Hyperlipidemia    Hypertension    Loss or death of partner 04/16/13   Low testosterone 03/22/2013   Past Surgical History:  Procedure Laterality Date   COLONOSCOPY WITH PROPOFOL N/A 07/19/2019   Procedure: COLONOSCOPY WITH PROPOFOL;  Surgeon: Jonathon Bellows, MD;  Location: [facility] ENDOSCOPY;  Service: Gastroenterology;  Laterality: N/A;   LAPAROSCOPIC GASTRIC BANDING  02/2011   Patient Active Problem List   Diagnosis Date Noted   Strep throat 07/12/2022   Prediabetes 07/06/2022   High risk sexual behavior 07/06/2022   OSA (obstructive sleep apnea) 11/25/2021   Hypogonadism in male 09/30/2019   PVD (peripheral vascular disease) ([place]) 06/11/2019   Spinal stenosis 06/11/2019   Erectile dysfunction 04/16/13   Epidermal cyst 01/14/2013   Encounter for annual general medical examination with abnormal findings in adult 07/28/2011   APL Lapband 03/01/11 06/15/2011   Cutaneous skin tags 03/14/2011   Hyperlipidemia 09/08/2010   GYNECOMASTIA, UNILATERAL 08/04/2010   OBESITY, MORBID 06/09/2009   Essential hypertension, benign 06/09/2009   OTHER DYSPHAGIA 06/09/2009    PCP: Alma Friendly, NP  REFERRING PROVIDER: Alma Friendly, NP  REFERRING DIAG:  [phone] (ICD-10-CM) - Spinal stenosis of lumbar region, unspecified whether neurogenic claudication present   Rationale for Evaluation and Treatment:  Rehabilitation  THERAPY DIAG:  Spinal stenosis of lumbar region, unspecified whether neurogenic claudication present  Other low back pain  Lumbar pain  Decreased ROM of lumbar spine  ONSET DATE: ~1 year  SUBJECTIVE:                                                                                                                                                                                           SUBJECTIVE STATEMENT:  Pt reports having a good weekend, meeting some friends yesterday.  Pt notes his back was doing well over the weekend.  Pt states that he has not been able to gather information on the [facility]  but plans to do so when mother moves back into her house at the end of this week.     PERTINENT HISTORY:   Pt reports he has been experiencing some increased pain in the lumbar spine and into both legs.  Pt has had experience with sciatica in the past.  Pt also has prior experience with PT and notes that the therapist gave him core exercises and told him that due to his weight, he would need to work on things at home to resolve the issue and that was all that was done for the most part.  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  Pt notes pain with sitting for prolonged period of time or heaviness in the LE's when he is walking for a while.  The sciatica acts out the most when in bed.    PAIN:  Are you having pain? No  PRECAUTIONS: None  WEIGHT BEARING RESTRICTIONS: No  FALLS:  Has patient fallen in last 6 months? No  LIVING ENVIRONMENT: Lives with: lives alone  but mother is staying with pt currently due to having a bad bout of Covid. Lives in: Mobile home Stairs:  Pt had a ramp installed due to his mother staying with him, so he does not have to go up any stairs. 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    NEXT MD VISIT: Does not currently have a date.  OBJECTIVE:   DIAGNOSTIC FINDINGS:   CLINICAL DATA:  Initial evaluation for lower back pain radiating into the bilateral hips and lower extremities with intermittent numbness at lateral aspect of right leg.   EXAM: MRI LUMBAR SPINE WITHOUT CONTRAST   TECHNIQUE: Multiplanar, multisequence MR imaging of the lumbar spine was performed. No intravenous contrast was administered.   COMPARISON:  Prior radiographs from 07/19/2017.  IMPRESSION: 1. Disc bulge with prominent facet hypertrophy at L2-3 with resultant moderate to severe spinal stenosis. 2. Disc bulge with facet hypertrophy at L3-4 with resultant mild to moderate bilateral L3 foraminal stenosis, left greater than right. 3. Shallow central disc protrusion at L4-5 without significant stenosis or neural impingement.  PATIENT SURVEYS:  Modified Oswestry 7  FOTO 60/68  SCREENING FOR RED FLAGS: Bowel or bladder incontinence: No Spinal tumors: No Cauda equina syndrome: No Compression fracture: No Abdominal aneurysm: No  COGNITION: Overall cognitive status: Within functional limits for tasks assessed     SENSATION: WFL, pt does have some numbness along the ITB.   POSTURE: rounded shoulders and forward head  LUMBAR ROM:   AROM eval  Flexion   Extension   Right lateral flexion   Left lateral flexion   Right rotation   Left rotation    (Blank rows = not tested)  LOWER EXTREMITY ROM:     LE ROM limited due to body habitus at this time.  LOWER EXTREMITY MMT:    MMT Right eval Left eval  Hip flexion 5 4  Hip abduction 5 5  Hip adduction 5 4  Knee flexion 5 4  Knee extension 5 5  Ankle dorsiflexion 5 5   (Blank rows = not  tested)  LUMBAR SPECIAL TESTS:  Slump test: Negative and FABER test: Positive  FUNCTIONAL TESTS:  5 times sit to stand: 14.21 Timed up and go (TUG): 10.91 6 minute walk test: Defer to next visit  GAIT: Distance walked: 10' Assistive device utilized: None Level of assistance: Complete Independence Comments: Pt has abnormal gait pattern throughout the testing and will be monitored during subsequent visits.  TODAY'S TREATMENT: DATE: 07/25/22  6MWT: 809 ft  TherEx:  Seated red physioball rollouts for improved lumbar ROM and stretching of paraspinals Standing palloff press 12.5#, 2x15 each direction Supine bridging with glute activation prior to liftoff, x10 Supine modified crunch with pushing UE's into physioball placed on abdomen, 2x10 Supine Lower Trunk Rotation, 2x15 each side Hooklying marches with core activation prior to liftoff of the LE's, 2x10 each LE  Pt educated on proper log roll technique for coming up into seated position from supine position.        PATIENT EDUCATION:  Education details: Pt educated on role of PT and services provided during current POC, along with prognosis and information about the clinic. Person educated: Patient Education method: Explanation Education comprehension: verbalized understanding  HOME EXERCISE PROGRAM:  Access Code: [email or web address] URL: [email or web address] Date: 07/19/2022 Prepared by: [place] Nation  Exercises - Supine Sciatic Nerve Glide  - 1 x daily - 7 x weekly - 3 sets - 10 reps - Standing Distal Sciatic Nerve Mobilization on Step  - 1 x daily - 7 x weekly - 3 sets - 10 reps - Standing Sciatic Nerve Mobilization on Step  - 1 x daily - 7 x weekly - 3 sets - 10 reps - Seated Sciatic Tensioner  - 1 x daily - 7 x weekly - 3 sets - 10 reps - Seated Slump Nerve Glide  - 1 x daily - 7 x weekly - 3 sets - 10 reps - Supine ITB Stretch with Strap  - 1 x daily - 7 x weekly - 3 sets - 10 reps - Standing ITB Stretch  -  1 x daily - 7 x weekly - 3 sets - 10 reps  ASSESSMENT:  CLINICAL IMPRESSION:  Pt able to tolerate improved core activation activities during session today.  Pt noted to have some discomfort in the ITB region during treatment at times, but able to tolerate the exercises and was advised to continue with stretches given as part of HEP.  Pt will continue to look for aquatic opportunities in the coming weeks in order to be able to tolerate reduced weight bearing exercises and resistance from water for strengthening.  Pt agreeable with the current POC and transitioning to aquatic exercises in local gym at this time.   Pt will continue to benefit from skilled therapy to address remaining deficits in order to improve overall QoL and return to PLOF.        OBJECTIVE IMPAIRMENTS: Abnormal gait, decreased activity tolerance, decreased endurance, decreased mobility, difficulty walking, decreased ROM, decreased strength, impaired flexibility, and obesity.   ACTIVITY LIMITATIONS: carrying, lifting, bending, standing, squatting, sleeping, stairs, bed mobility, locomotion level, and caring for others  PARTICIPATION LIMITATIONS:  pt is able to do most things, but has difficulty with   PERSONAL FACTORS: Fitness, Past/current experiences, Profession, Time since onset of injury/illness/exacerbation, and 3+ comorbidities: obesity, spinal stenosis, PVD, HTN  are also affecting patient's functional outcome.   REHAB POTENTIAL: Fair , obesity and lack of mobility may have difficulty with  exercises given.  Pt   CLINICAL DECISION MAKING: Stable/uncomplicated  EVALUATION COMPLEXITY: Moderate   GOALS: Goals reviewed with patient? Yes  SHORT TERM GOALS: Target date: 08/16/2022  Pt will be independent with HEP in order to demonstrate increased ability to perform tasks related to occupation/hobbies. Baseline: Given HEP at initial evaluation Goal status: INITIAL  LONG TERM GOALS: Target date: 10/11/2022  1.   Patient will complete five times sit to stand test in <10 seconds indicating an increased LE strength and improved balance. Baseline: 14.21 sec Goal status: INITIAL  2.  Patient will increase FOTO score to equal to or greater than 68 to demonstrate statistically significant improvement in mobility and quality of life.  Baseline: FOTO: 60 Goal status: INITIAL   3.  Patient will reduce timed up and go to <10 seconds to reduce fall risk and demonstrate improved transfer/gait ability. Baseline: 10.91 sec Goal status: INITIAL  4.  Patient will increase six minute walk test distance to >1000 for progression to community ambulator and improve gait ability Baseline: 809' w/o AD Goal status: INITIAL    PLAN:  PT FREQUENCY: 1x/week  PT DURATION: 12 weeks  PLANNED INTERVENTIONS: Therapeutic exercises, Therapeutic activity, Neuromuscular re-education, Balance training, Gait training, Patient/Family education, Self Care, Joint mobilization, Joint manipulation, Stair training, Vestibular training, DME instructions, Aquatic Therapy, Dry Needling, Spinal manipulation, Spinal mobilization, Cryotherapy, Moist heat, Traction, and Manual therapy.  PLAN FOR NEXT SESSION:   Continue core strengthening and lumbar mobilization.   Gwenlyn Saran, PT, DPT Physical Therapist- [facility]  07/25/22, 1:15 PM

## 2022-07-28 ENCOUNTER — Other Ambulatory Visit (HOSPITAL_COMMUNITY): Payer: Self-pay

## 2022-08-01 ENCOUNTER — Ambulatory Visit: Payer: 59 | Attending: Primary Care

## 2022-08-01 DIAGNOSIS — M5459 Other low back pain: Secondary | ICD-10-CM | POA: Insufficient documentation

## 2022-08-01 DIAGNOSIS — M5386 Other specified dorsopathies, lumbar region: Secondary | ICD-10-CM | POA: Diagnosis present

## 2022-08-01 DIAGNOSIS — M48061 Spinal stenosis, lumbar region without neurogenic claudication: Secondary | ICD-10-CM | POA: Diagnosis present

## 2022-08-01 DIAGNOSIS — M545 Low back pain, unspecified: Secondary | ICD-10-CM | POA: Insufficient documentation

## 2022-08-01 NOTE — Therapy (Signed)
OUTPATIENT PHYSICAL THERAPY THORACOLUMBAR EVALUATION   Patient Name: Timothy Delacruz MRN: [id number] DOB:10/28/1970, 52 y.o., male Today's Date: 08/01/2022  END OF SESSION:  PT End of Session - 08/01/22 0844     Visit Number 3    Number of Visits 24    Date for PT Re-Evaluation 10/11/22    PT Start Time 0845    PT Stop Time 0930    PT Time Calculation (min) 45 min    Activity Tolerance Patient tolerated treatment well    Behavior During Therapy [facility] for tasks assessed/performed              Past Medical History:  Diagnosis Date   Acute knee pain 10/22/2019   Hyperlipidemia    Hypertension    Loss or death of partner 04-18-13   Low testosterone 03/22/2013   Past Surgical History:  Procedure Laterality Date   COLONOSCOPY WITH PROPOFOL N/A 07/19/2019   Procedure: COLONOSCOPY WITH PROPOFOL;  Surgeon: Jonathon Bellows, MD;  Location: [facility] ENDOSCOPY;  Service: Gastroenterology;  Laterality: N/A;   LAPAROSCOPIC GASTRIC BANDING  02/2011   Patient Active Problem List   Diagnosis Date Noted   Strep throat 07/12/2022   Prediabetes 07/06/2022   High risk sexual behavior 07/06/2022   OSA (obstructive sleep apnea) 11/25/2021   Hypogonadism in male 09/30/2019   PVD (peripheral vascular disease) ([place]) 06/11/2019   Spinal stenosis 06/11/2019   Erectile dysfunction 2013/04/18   Epidermal cyst 01/14/2013   Encounter for annual general medical examination with abnormal findings in adult 07/28/2011   APL Lapband 03/01/11 06/15/2011   Cutaneous skin tags 03/14/2011   Hyperlipidemia 09/08/2010   GYNECOMASTIA, UNILATERAL 08/04/2010   OBESITY, MORBID 06/09/2009   Essential hypertension, benign 06/09/2009   OTHER DYSPHAGIA 06/09/2009    PCP: Alma Friendly, NP  REFERRING PROVIDER: Alma Friendly, NP  REFERRING DIAG:  [phone] (ICD-10-CM) - Spinal stenosis of lumbar region, unspecified whether neurogenic claudication present   Rationale for Evaluation and Treatment:  Rehabilitation  THERAPY DIAG:  Spinal stenosis of lumbar region, unspecified whether neurogenic claudication present  Other low back pain  Lumbar pain  Decreased ROM of lumbar spine  ONSET DATE: ~1 year  SUBJECTIVE:                                                                                                                                                                                           SUBJECTIVE STATEMENT:  Pt reports he was able to get his mom moved back to her house last week.  Pt notes he had to shift his schedule and had to work over the weekend due to the move.  [facility]aquatic center after therapy.   PERTINENT HISTORY:   Pt reports he has been experiencing some increased pain in the lumbar spine and into both legs.  Pt has had experience with sciatica in the past.  Pt also has prior experience with PT and notes that the therapist gave him core exercises and told him that due to his weight, he would need to work on things at home to resolve the issue and that was all that was done for the most part.  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  Pt notes pain with sitting for prolonged period of time or heaviness in the LE's when he is walking for a while.  The sciatica acts out the most when in bed.    PAIN:  Are you having pain? No  PRECAUTIONS: None  WEIGHT BEARING RESTRICTIONS: No  FALLS:  Has patient fallen in last 6 months? No  LIVING ENVIRONMENT: Lives with: lives alone  but mother is staying with pt currently due to having a bad bout of Covid. Lives in: Mobile home Stairs:  Pt had a ramp installed due to his mother staying with him, so he does not have to go up any stairs. 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    NEXT MD VISIT: Does not currently have a date.  OBJECTIVE:   DIAGNOSTIC FINDINGS:   CLINICAL DATA:  Initial evaluation for lower back pain radiating into the bilateral hips and lower extremities with intermittent numbness at lateral aspect of right leg.   EXAM: MRI LUMBAR SPINE WITHOUT CONTRAST   TECHNIQUE: Multiplanar, multisequence MR imaging of the lumbar spine was performed. No intravenous contrast was administered.   COMPARISON:  Prior radiographs from 07/19/2017.  IMPRESSION: 1. Disc bulge with prominent facet hypertrophy at L2-3 with resultant moderate to severe spinal stenosis. 2. Disc bulge with facet hypertrophy at L3-4 with resultant mild to moderate bilateral L3 foraminal stenosis, left greater than right. 3. Shallow central disc protrusion at L4-5 without significant stenosis or neural impingement.  PATIENT SURVEYS:  Modified Oswestry 7  FOTO 60/68  SCREENING FOR RED FLAGS: Bowel or bladder incontinence: No Spinal tumors: No Cauda equina syndrome: No Compression fracture: No Abdominal aneurysm: No  COGNITION: Overall cognitive status: Within functional limits for tasks assessed     SENSATION: WFL, pt does have some numbness along the ITB.   POSTURE: rounded shoulders and forward head  LUMBAR ROM:   AROM eval  Flexion   Extension   Right lateral flexion   Left lateral flexion   Right rotation   Left rotation    (Blank rows = not tested)  LOWER EXTREMITY ROM:     LE ROM limited due to body habitus at this time.  LOWER EXTREMITY MMT:    MMT Right eval Left eval  Hip flexion 5 4  Hip abduction 5 5  Hip adduction 5 4  Knee flexion 5 4  Knee extension 5 5  Ankle dorsiflexion 5 5   (Blank rows = not  tested)  LUMBAR SPECIAL TESTS:  Slump test: Negative and FABER test: Positive  FUNCTIONAL TESTS:  5 times sit to stand: 14.21 Timed up and go (TUG): 10.91 6 minute walk test: Defer to next visit  GAIT: Distance walked: 10' Assistive device utilized: None Level of assistance: Complete Independence Comments: Pt has abnormal gait pattern throughout the testing and will be monitored during subsequent visits.  TODAY'S TREATMENT: DATE: 08/01/22   TherEx:  Seated red physioball rollouts for improved lumbar ROM and stretching of paraspinals Standing palloff press 12.5#, 2x15 each direction Supine bridging with glute activation prior to liftoff, x10 Hooklying modified crunch with pushing UE's into physioball placed on abdomen, 2x10 Supine Lower Trunk Rotation, 2x15 each side Hooklying PPT/TrA activation, 2x10 with 3 sec holds Hooklying marches with core activation prior to liftoff of the LE's, 2x10 each LE Anti-Rotation (arms extended) sidestepping with 12.5# resistance at cable column, x5 each direction   Pt educated on proper log roll technique for coming up into seated position from supine position.        PATIENT EDUCATION:  Education details: Pt educated on role of PT and services provided during current POC, along with prognosis and information about the clinic. Person educated: Patient Education method: Explanation Education comprehension: verbalized understanding  HOME EXERCISE PROGRAM:  Access Code: [email or web address] URL: [email or web address] Date: 07/19/2022 Prepared by: [place] Nation  Exercises - Supine Sciatic Nerve Glide  - 1 x daily - 7 x weekly - 3 sets - 10 reps - Standing Distal Sciatic Nerve Mobilization on Step  - 1 x daily - 7 x weekly - 3 sets - 10 reps - Standing Sciatic Nerve Mobilization on Step  - 1 x daily - 7 x weekly - 3 sets - 10 reps - Seated Sciatic Tensioner  - 1 x daily - 7 x weekly - 3 sets - 10 reps - Seated Slump Nerve Glide  - 1  x daily - 7 x weekly - 3 sets - 10 reps - Supine ITB Stretch with Strap  - 1 x daily - 7 x weekly - 3 sets - 10 reps - Standing ITB Stretch  - 1 x daily - 7 x weekly - 3 sets - 10 reps  ASSESSMENT:  CLINICAL IMPRESSION:  Pt continues to put forth great effort throughout the session.  Pt continues to improve with exercise tolerance with the exercises given and is able to perform with much better breathing techniques during the session today as well.  Pt self-reports being compliant with HEP, but not as much as he would like to be due to the moving of his mother this week.  Pt to continue with seeking [employer] membership and shifting to aquatic based exercise program.   Pt will continue to benefit from skilled therapy to address remaining deficits in order to improve overall QoL and return to PLOF.          OBJECTIVE IMPAIRMENTS: Abnormal gait, decreased activity tolerance, decreased endurance, decreased mobility, difficulty walking, decreased ROM, decreased strength, impaired flexibility, and obesity.   ACTIVITY LIMITATIONS: carrying, lifting, bending, standing, squatting, sleeping, stairs, bed mobility, locomotion level, and caring for others  PARTICIPATION LIMITATIONS:  pt is able to do most things, but has difficulty with   PERSONAL FACTORS: Fitness, Past/current experiences, Profession, Time since onset of injury/illness/exacerbation, and 3+ comorbidities: obesity, spinal stenosis, PVD, HTN  are also affecting patient's functional outcome.   REHAB POTENTIAL: Fair , obesity  and lack of mobility may have difficulty with exercises given.  Pt   CLINICAL DECISION MAKING: Stable/uncomplicated  EVALUATION COMPLEXITY: Moderate   GOALS: Goals reviewed with patient? Yes  SHORT TERM GOALS: Target date: 08/16/2022  Pt will be independent with HEP in order to demonstrate increased ability to perform tasks related to occupation/hobbies. Baseline: Given HEP at initial evaluation Goal status:  INITIAL  LONG TERM GOALS: Target date: 10/11/2022  1.  Patient will complete five times sit to stand test in <10 seconds indicating an increased LE strength and improved balance. Baseline: 14.21 sec Goal status: INITIAL  2.  Patient will increase FOTO score to equal to or greater than 68 to demonstrate statistically significant improvement in mobility and quality of life.  Baseline: FOTO: 60 Goal status: INITIAL   3.  Patient will reduce timed up and go to <10 seconds to reduce fall risk and demonstrate improved transfer/gait ability. Baseline: 10.91 sec Goal status: INITIAL  4.  Patient will increase six minute walk test distance to >1000 for progression to community ambulator and improve gait ability Baseline: 809' w/o AD Goal status: INITIAL    PLAN:  PT FREQUENCY: 1x/week  PT DURATION: 12 weeks  PLANNED INTERVENTIONS: Therapeutic exercises, Therapeutic activity, Neuromuscular re-education, Balance training, Gait training, Patient/Family education, Self Care, Joint mobilization, Joint manipulation, Stair training, Vestibular training, DME instructions, Aquatic Therapy, Dry Needling, Spinal manipulation, Spinal mobilization, Cryotherapy, Moist heat, Traction, and Manual therapy.  PLAN FOR NEXT SESSION:   Continue core strengthening and lumbar mobilization.   Gwenlyn Saran, PT, DPT Physical Therapist- [facility]  08/01/22, 9:42 AM

## 2022-08-09 ENCOUNTER — Ambulatory Visit: Payer: 59

## 2022-08-09 DIAGNOSIS — M48061 Spinal stenosis, lumbar region without neurogenic claudication: Secondary | ICD-10-CM | POA: Diagnosis not present

## 2022-08-09 DIAGNOSIS — M5459 Other low back pain: Secondary | ICD-10-CM

## 2022-08-09 DIAGNOSIS — M5386 Other specified dorsopathies, lumbar region: Secondary | ICD-10-CM

## 2022-08-09 DIAGNOSIS — M545 Low back pain, unspecified: Secondary | ICD-10-CM

## 2022-08-09 NOTE — Therapy (Signed)
OUTPATIENT PHYSICAL THERAPY THORACOLUMBAR EVALUATION   Patient Name: Timothy Delacruz MRN: [id number] DOB:[birth year], 52 y.o., male Today's Date: 08/09/2022  END OF SESSION:  PT End of Session - 08/09/22 1105     Visit Number 4    Number of Visits 24    Date for PT Re-Evaluation 10/11/22    PT Start Time 1103    PT Stop Time 1145    PT Time Calculation (min) 42 min    Activity Tolerance Patient tolerated treatment well    Behavior During Therapy [facility] for tasks assessed/performed              Past Medical History:  Diagnosis Date   Acute knee pain 10/22/2019   Hyperlipidemia    Hypertension    Loss or death of partner 2013/04/07   Low testosterone 03/22/2013   Past Surgical History:  Procedure Laterality Date   COLONOSCOPY WITH PROPOFOL N/A 07/19/2019   Procedure: COLONOSCOPY WITH PROPOFOL;  Surgeon: Jonathon Bellows, MD;  Location: [facility] ENDOSCOPY;  Service: Gastroenterology;  Laterality: N/A;   LAPAROSCOPIC GASTRIC BANDING  02/2011   Patient Active Problem List   Diagnosis Date Noted   Strep throat 07/12/2022   Prediabetes 07/06/2022   High risk sexual behavior 07/06/2022   OSA (obstructive sleep apnea) 11/25/2021   Hypogonadism in male 09/30/2019   PVD (peripheral vascular disease) ([place]) 06/11/2019   Spinal stenosis 06/11/2019   Erectile dysfunction April 07, 2013   Epidermal cyst 01/14/2013   Encounter for annual general medical examination with abnormal findings in adult 07/28/2011   APL Lapband 03/01/11 06/15/2011   Cutaneous skin tags 03/14/2011   Hyperlipidemia 09/08/2010   GYNECOMASTIA, UNILATERAL 08/04/2010   OBESITY, MORBID 06/09/2009   Essential hypertension, benign 06/09/2009   OTHER DYSPHAGIA 06/09/2009    PCP: Alma Friendly, NP  REFERRING PROVIDER: Alma Friendly, NP  REFERRING DIAG:  [phone] (ICD-10-CM) - Spinal stenosis of lumbar region, unspecified whether neurogenic claudication present   Rationale for Evaluation and Treatment:  Rehabilitation  THERAPY DIAG:  Spinal stenosis of lumbar region, unspecified whether neurogenic claudication present  Other low back pain  Lumbar pain  Decreased ROM of lumbar spine  ONSET DATE: ~1 year  SUBJECTIVE:                                                                                                                                                                                           SUBJECTIVE STATEMENT:  Pt reports that he has started his membership at the [employer].  Pt notes that he was able to attend the first class yesterday in the water aerobics.  Pt states he does not have  any residual soreness from the stretches yesterday.  Pt starting at the beginners class.   PERTINENT HISTORY:   Pt reports he has been experiencing some increased pain in the lumbar spine and into both legs.  Pt has had experience with sciatica in the past.  Pt also has prior experience with PT and notes that the therapist gave him core exercises and told him that due to his weight, he would need to work on things at home to resolve the issue and that was all that was done for the most part.  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  Pt notes pain with sitting for prolonged period of time or heaviness in the LE's when he is walking for a while.  The sciatica acts out the most when in bed.    PAIN:  Are you having pain? 2/10  PRECAUTIONS: None  WEIGHT BEARING RESTRICTIONS: No  FALLS:  Has patient fallen in last 6 months? No  LIVING ENVIRONMENT: Lives with: lives alone  but mother is staying with pt currently due to having a bad bout of Covid. Lives in: Mobile home Stairs:  Pt had a ramp installed due to his mother staying with him, so he does not have to go up any stairs. 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    NEXT MD VISIT: Does not currently have a date.  OBJECTIVE:   DIAGNOSTIC FINDINGS:   CLINICAL DATA:  Initial evaluation for lower back pain radiating into the bilateral hips and lower extremities with intermittent numbness at lateral aspect of right leg.   EXAM: MRI LUMBAR SPINE WITHOUT CONTRAST   TECHNIQUE: Multiplanar, multisequence MR imaging of the lumbar spine was performed. No intravenous contrast was administered.   COMPARISON:  Prior radiographs from 07/19/2017.  IMPRESSION: 1. Disc bulge with prominent facet hypertrophy at L2-3 with resultant moderate to severe spinal stenosis. 2. Disc bulge with facet hypertrophy at L3-4 with resultant mild to moderate bilateral L3 foraminal stenosis, left greater than right. 3. Shallow central disc protrusion at L4-5 without significant stenosis or neural impingement.  PATIENT SURVEYS:  Modified Oswestry 7  FOTO 60/68  SCREENING FOR RED FLAGS: Bowel or bladder incontinence: No Spinal tumors: No Cauda equina syndrome: No Compression fracture: No Abdominal aneurysm: No  COGNITION: Overall cognitive status: Within functional limits for tasks assessed     SENSATION: WFL, pt does have some numbness along the ITB.   POSTURE: rounded shoulders and forward head  LUMBAR ROM:   AROM eval  Flexion   Extension   Right lateral flexion   Left lateral flexion   Right rotation   Left rotation    (Blank rows = not tested)  LOWER EXTREMITY ROM:     LE ROM limited due to body habitus at this time.  LOWER EXTREMITY MMT:    MMT Right eval Left eval  Hip flexion 5 4  Hip abduction 5 5  Hip adduction 5 4  Knee flexion 5 4  Knee extension 5 5  Ankle dorsiflexion 5 5   (  Blank rows = not  tested)  LUMBAR SPECIAL TESTS:  Slump test: Negative and FABER test: Positive  FUNCTIONAL TESTS:  5 times sit to stand: 14.21 Timed up and go (TUG): 10.91 6 minute walk test: Defer to next visit  GAIT: Distance walked: 10' Assistive device utilized: None Level of assistance: Complete Independence Comments: Pt has abnormal gait pattern throughout the testing and will be monitored during subsequent visits.  TODAY'S TREATMENT: DATE: 08/09/22   TherEx:  Seated red physioball rollouts for improved lumbar ROM and stretching of paraspinals Standing palloff press 12.5#, 2x15 each direction Supine bridging with glute activation prior to liftoff, 2x10 Supine Lower Trunk Rotation, 2x15 each side Hooklying PPT/TrA activation, 2x10 with 3 sec holds Hooklying marches with core activation prior to liftoff of the LE's, 2x10 each LE Seated hamstring stretch at EOB, 30 sec bouts x4 each LE   Pt educated on proper log roll technique for coming up into seated position from supine position.        PATIENT EDUCATION:  Education details: Pt educated on role of PT and services provided during current POC, along with prognosis and information about the clinic. Person educated: Patient Education method: Explanation Education comprehension: verbalized understanding  HOME EXERCISE PROGRAM:  Access Code: [email or web address] URL: [email or web address] Date: 07/19/2022 Prepared by: [place]  Exercises - Supine Sciatic Nerve Glide  - 1 x daily - 7 x weekly - 3 sets - 10 reps - Standing Distal Sciatic Nerve Mobilization on Step  - 1 x daily - 7 x weekly - 3 sets - 10 reps - Standing Sciatic Nerve Mobilization on Step  - 1 x daily - 7 x weekly - 3 sets - 10 reps - Seated Sciatic Tensioner  - 1 x daily - 7 x weekly - 3 sets - 10 reps - Seated Slump Nerve Glide  - 1 x daily - 7 x weekly - 3 sets - 10 reps - Supine ITB Stretch with Strap  - 1 x daily - 7 x weekly - 3 sets - 10 reps -  Standing ITB Stretch  - 1 x daily - 7 x weekly - 3 sets - 10 reps  ASSESSMENT:  CLINICAL IMPRESSION:  Pt performed well with the exercises without any adverse effects.  Pt noted to have some soreness in the groin, which he attributed it to working out in the pool yesterday, but has noted in the past.  Pt introduced to hamstring stretches as he continues to have muscle cramping in the hamstrings when performing tasks.  Pt and therapist discussed aquatic therapy and that being the avenue of success for pt.  Pt and therapist both agreed to likely discharging in 2 weeks in order to move more towards aquatic exercise program.  Therapist to construct aquatic HEP for pt to partake in when they are not involved in aquatic classes.   Pt will continue to benefit from skilled therapy to address remaining deficits in order to improve overall QoL and return to PLOF.            OBJECTIVE IMPAIRMENTS: Abnormal gait, decreased activity tolerance, decreased endurance, decreased mobility, difficulty walking, decreased ROM, decreased strength, impaired flexibility, and obesity.   ACTIVITY LIMITATIONS: carrying, lifting, bending, standing, squatting, sleeping, stairs, bed mobility, locomotion level, and caring for others  PARTICIPATION LIMITATIONS:  pt is able to do most things, but has difficulty with   PERSONAL FACTORS: Fitness, Past/current experiences, Profession, Time since onset of injury/illness/exacerbation, and 3+ comorbidities: obesity, spinal stenosis, PVD,  HTN  are also affecting patient's functional outcome.   REHAB POTENTIAL: Fair , obesity and lack of mobility may have difficulty with exercises given.  Pt   CLINICAL DECISION MAKING: Stable/uncomplicated  EVALUATION COMPLEXITY: Moderate   GOALS: Goals reviewed with patient? Yes  SHORT TERM GOALS: Target date: 08/16/2022  Pt will be independent with HEP in order to demonstrate increased ability to perform tasks related to  occupation/hobbies. Baseline: Given HEP at initial evaluation Goal status: INITIAL  LONG TERM GOALS: Target date: 10/11/2022  1.  Patient will complete five times sit to stand test in <10 seconds indicating an increased LE strength and improved balance. Baseline: 14.21 sec Goal status: INITIAL  2.  Patient will increase FOTO score to equal to or greater than 68 to demonstrate statistically significant improvement in mobility and quality of life.  Baseline: FOTO: 60 Goal status: INITIAL   3.  Patient will reduce timed up and go to <10 seconds to reduce fall risk and demonstrate improved transfer/gait ability. Baseline: 10.91 sec Goal status: INITIAL  4.  Patient will increase six minute walk test distance to >1000 for progression to community ambulator and improve gait ability Baseline: 809' w/o AD Goal status: INITIAL    PLAN:  PT FREQUENCY: 1x/week  PT DURATION: 12 weeks  PLANNED INTERVENTIONS: Therapeutic exercises, Therapeutic activity, Neuromuscular re-education, Balance training, Gait training, Patient/Family education, Self Care, Joint mobilization, Joint manipulation, Stair training, Vestibular training, DME instructions, Aquatic Therapy, Dry Needling, Spinal manipulation, Spinal mobilization, Cryotherapy, Moist heat, Traction, and Manual therapy.  PLAN FOR NEXT SESSION:   Continue core strengthening and lumbar mobilization.   Gwenlyn Saran, PT, DPT Physical Therapist- [facility]  08/09/22, 4:29 PM

## 2022-08-15 ENCOUNTER — Ambulatory Visit: Payer: 59

## 2022-08-15 DIAGNOSIS — M5386 Other specified dorsopathies, lumbar region: Secondary | ICD-10-CM

## 2022-08-15 DIAGNOSIS — M48061 Spinal stenosis, lumbar region without neurogenic claudication: Secondary | ICD-10-CM | POA: Diagnosis not present

## 2022-08-15 DIAGNOSIS — M5459 Other low back pain: Secondary | ICD-10-CM

## 2022-08-15 DIAGNOSIS — M545 Low back pain, unspecified: Secondary | ICD-10-CM

## 2022-08-15 NOTE — Therapy (Signed)
OUTPATIENT PHYSICAL THERAPY THORACOLUMBAR TREATMENT   Patient Name: Timothy Delacruz MRN: [id number] DOB:[birth year], 52 y.o., male Today's Date: 08/15/2022  END OF SESSION:  PT End of Session - 08/15/22 0937     Visit Number 5    Number of Visits 24    Date for PT Re-Evaluation 10/11/22    PT Start Time 0934    PT Stop Time 1015    PT Time Calculation (min) 41 min    Activity Tolerance Patient tolerated treatment well    Behavior During Therapy [facility] for tasks assessed/performed               Past Medical History:  Diagnosis Date   Acute knee pain 10/22/2019   Hyperlipidemia    Hypertension    Loss or death of partner 03-25-2013   Low testosterone 03/22/2013   Past Surgical History:  Procedure Laterality Date   COLONOSCOPY WITH PROPOFOL N/A 07/19/2019   Procedure: COLONOSCOPY WITH PROPOFOL;  Surgeon: Jonathon Bellows, MD;  Location: [facility] ENDOSCOPY;  Service: Gastroenterology;  Laterality: N/A;   LAPAROSCOPIC GASTRIC BANDING  02/2011   Patient Active Problem List   Diagnosis Date Noted   Strep throat 07/12/2022   Prediabetes 07/06/2022   High risk sexual behavior 07/06/2022   OSA (obstructive sleep apnea) 11/25/2021   Hypogonadism in male 09/30/2019   PVD (peripheral vascular disease) ([place]) 06/11/2019   Spinal stenosis 06/11/2019   Erectile dysfunction 03/25/2013   Epidermal cyst 01/14/2013   Encounter for annual general medical examination with abnormal findings in adult 07/28/2011   APL Lapband 03/01/11 06/15/2011   Cutaneous skin tags 03/14/2011   Hyperlipidemia 09/08/2010   GYNECOMASTIA, UNILATERAL 08/04/2010   OBESITY, MORBID 06/09/2009   Essential hypertension, benign 06/09/2009   OTHER DYSPHAGIA 06/09/2009    PCP: Alma Friendly, NP  REFERRING PROVIDER: Alma Friendly, NP  REFERRING DIAG:  [phone] (ICD-10-CM) - Spinal stenosis of lumbar region, unspecified whether neurogenic claudication present   Rationale for Evaluation and Treatment:  Rehabilitation  THERAPY DIAG:  Spinal stenosis of lumbar region, unspecified whether neurogenic claudication present  Other low back pain  Lumbar pain  Decreased ROM of lumbar spine  ONSET DATE: ~1 year  SUBJECTIVE:                                                                                                                                                                                           SUBJECTIVE STATEMENT:  Pt reports that he has been consistent with the [employer] other than this morning.  Pt notes he had an uneventful weekend, but is going to be busy over the next two days taking his  mom to her neurology appointment.  PERTINENT HISTORY:   Pt reports he has been experiencing some increased pain in the lumbar spine and into both legs.  Pt has had experience with sciatica in the past.  Pt also has prior experience with PT and notes that the therapist gave him core exercises and told him that due to his weight, he would need to work on things at home to resolve the issue and that was all that was done for the most part.  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  Pt notes pain with sitting for prolonged period of time or heaviness in the LE's when he is walking for a while.  The sciatica acts out the most when in bed.    PAIN:  Are you having pain? 2/10  PRECAUTIONS: None  WEIGHT BEARING RESTRICTIONS: No  FALLS:  Has patient fallen in last 6 months? No  LIVING ENVIRONMENT: Lives with: lives alone  but mother is staying with pt currently due to having a bad bout of Covid. Lives in: Mobile home Stairs:  Pt had a ramp installed due to his mother staying with him, so he does not have to go up any stairs. 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    NEXT MD VISIT: Does not currently have a date.  OBJECTIVE:   DIAGNOSTIC FINDINGS:   CLINICAL DATA:  Initial evaluation for lower back pain radiating into the bilateral hips and lower extremities with intermittent numbness at lateral aspect of right leg.   EXAM: MRI LUMBAR SPINE WITHOUT CONTRAST   TECHNIQUE: Multiplanar, multisequence MR imaging of the lumbar spine was performed. No intravenous contrast was administered.   COMPARISON:  Prior radiographs from 07/19/2017.  IMPRESSION: 1. Disc bulge with prominent facet hypertrophy at L2-3 with resultant moderate to severe spinal stenosis. 2. Disc bulge with facet hypertrophy at L3-4 with resultant mild to moderate bilateral L3 foraminal stenosis, left greater than right. 3. Shallow central disc protrusion at L4-5 without significant stenosis or neural impingement.  PATIENT SURVEYS:  Modified Oswestry 7  FOTO 60/68  SCREENING FOR RED FLAGS: Bowel or bladder incontinence: No Spinal tumors: No Cauda equina syndrome: No Compression fracture: No Abdominal aneurysm: No  COGNITION: Overall cognitive status: Within functional limits for tasks assessed     SENSATION: WFL, pt does have some numbness along the ITB.   POSTURE: rounded shoulders and forward head  LUMBAR ROM:   AROM eval  Flexion   Extension   Right lateral flexion   Left lateral flexion   Right rotation   Left rotation    (Blank rows = not tested)  LOWER EXTREMITY ROM:     LE ROM limited due to body habitus at this time.  LOWER EXTREMITY MMT:    MMT Right eval Left eval  Hip flexion 5 4  Hip abduction 5 5  Hip adduction 5 4  Knee flexion 5 4  Knee extension 5 5  Ankle dorsiflexion 5 5   (Blank rows = not tested)  LUMBAR SPECIAL TESTS:  Slump test: Negative and FABER  test: Positive  FUNCTIONAL TESTS:  5 times sit to stand: 14.21 Timed up and go (TUG): 10.91 6 minute walk test: Defer to next visit  GAIT: Distance walked: 10' Assistive device utilized: None Level of assistance: Complete Independence Comments: Pt has abnormal gait pattern throughout the testing and will be monitored during subsequent visits.  TODAY'S TREATMENT: DATE: 08/15/22    TherEx:  Seated red physioball rollouts for improved lumbar ROM and stretching of paraspinals Standing palloff press 17.5#, 2x15 each direction, pt noting some increase discomfort in the knee due to the resistance  Supine bridging with glute activation prior to liftoff, 2x10 Supine Lower Trunk Rotation, 2x15 each side Hooklying PPT/TrA activation, 2x10 with 3 sec holds Hooklying marches with core activation prior to liftoff of the LE's, 2x10 each LE Hooklying bent knee fall outs, 2x10 with 30 sec holds  Pt given aquatic exercises as part of HEP and were demonstrated by therapist for proper technique and pt education.    PATIENT EDUCATION:  Education details: Pt educated on role of PT and services provided during current POC, along with prognosis and information about the clinic. Person educated: Patient Education method: Explanation Education comprehension: verbalized understanding  HOME EXERCISE PROGRAM:   Access Code: [email or web address] URL: [email or web address] Date: 08/15/2022 Prepared by: [place]  Exercises - Hamstring Stretch at [employer]  - 1 x daily - 7 x weekly - 3 sets - 10 reps - Standing Hip Abduction Adduction at [place]  - 1 x daily - 7 x weekly - 3 sets - 10 reps - Standing Hip Flexion Extension at [employer]  - 1 x daily - 7 x weekly - 3 sets - 10 reps - Hip Flexor Stretch at [employer]  - 1 x daily - 7 x weekly - 3 sets - 10 reps - Squat  - 1 x daily - 7 x weekly - 3 sets - 10 reps - Flutter Kick at [employer]  - 1 x daily - 7 x weekly - 3 sets - 10  reps   Access Code: [email or web address] URL: [email or web address] Date: 07/19/2022 Prepared by: [place]  Exercises - Supine Sciatic Nerve Glide  - 1 x daily - 7 x weekly - 3 sets - 10 reps - Standing Distal Sciatic Nerve Mobilization on Step  - 1 x daily - 7 x weekly - 3 sets - 10 reps - Standing Sciatic Nerve Mobilization on Step  - 1 x daily - 7 x weekly - 3 sets - 10 reps - Seated Sciatic Tensioner  - 1 x daily - 7 x weekly - 3 sets - 10 reps - Seated Slump Nerve Glide  - 1 x daily - 7 x weekly - 3 sets - 10 reps - Supine ITB Stretch with Strap  - 1 x daily - 7 x weekly - 3 sets - 10 reps - Standing ITB Stretch  - 1 x daily - 7 x weekly - 3 sets - 10 reps    ASSESSMENT:  CLINICAL IMPRESSION:  Pt continues to respond well to all the exercises given and was able to understand all the given aquatic exercises without complication.  Therapist demonstrated the exercises over ground and pt notes he should be able to remember, but has the video assistance if needed.  Pt continues to perform well with all other tasks and is responding well to the stretches and therex given to him during treatment.  Plan to discharge at next visit.  Pt will continue to benefit from skilled therapy to address remaining deficits in order to improve overall QoL and return to PLOF.       OBJECTIVE IMPAIRMENTS: Abnormal gait, decreased activity tolerance, decreased endurance, decreased mobility, difficulty walking, decreased ROM, decreased strength, impaired flexibility, and obesity.   ACTIVITY LIMITATIONS: carrying, lifting, bending, standing, squatting, sleeping, stairs, bed mobility, locomotion level, and caring for others  PARTICIPATION LIMITATIONS:  pt is able to do most things, but has difficulty with   PERSONAL FACTORS: Fitness, Past/current experiences, Profession, Time since onset of injury/illness/exacerbation, and 3+ comorbidities: obesity, spinal stenosis, PVD, HTN  are also affecting  patient's functional outcome.   REHAB POTENTIAL: Fair , obesity and lack of mobility may have difficulty with exercises given.  Pt   CLINICAL DECISION MAKING: Stable/uncomplicated  EVALUATION COMPLEXITY: Moderate   GOALS: Goals reviewed with patient? Yes  SHORT TERM GOALS: Target date: 08/16/2022  Pt will be independent with HEP in order to demonstrate increased ability to perform tasks related to occupation/hobbies. Baseline: Given HEP at initial evaluation Goal status: INITIAL  LONG TERM GOALS: Target date: 10/11/2022  1.  Patient will complete five times sit to stand test in <10 seconds indicating an increased LE strength and improved balance. Baseline: 14.21 sec Goal status: INITIAL  2.  Patient will increase FOTO score to equal to or greater than 68 to demonstrate statistically significant improvement in mobility and quality of life.  Baseline: FOTO: 60 Goal status: INITIAL   3.  Patient will reduce timed up and go to <10 seconds to reduce fall risk and demonstrate improved transfer/gait ability. Baseline: 10.91 sec Goal status: INITIAL  4.  Patient will increase six minute walk test distance to >1000 for progression to community ambulator and improve gait ability Baseline: 809' w/o AD Goal status: INITIAL    PLAN:  PT FREQUENCY: 1x/week  PT DURATION: 12 weeks  PLANNED INTERVENTIONS: Therapeutic exercises, Therapeutic activity, Neuromuscular re-education, Balance training, Gait training, Patient/Family education, Self Care, Joint mobilization, Joint manipulation, Stair training, Vestibular training, DME instructions, Aquatic Therapy, Dry Needling, Spinal manipulation, Spinal mobilization, Cryotherapy, Moist heat, Traction, and Manual therapy.  PLAN FOR NEXT SESSION:   Continue core strengthening and lumbar mobilization.  D/C at next visit.   Gwenlyn Saran, PT, DPT Physical Therapist- [facility]  08/15/22, 1:05 PM

## 2022-08-22 ENCOUNTER — Ambulatory Visit: Payer: 59

## 2022-08-22 DIAGNOSIS — M48061 Spinal stenosis, lumbar region without neurogenic claudication: Secondary | ICD-10-CM

## 2022-08-22 DIAGNOSIS — M545 Low back pain, unspecified: Secondary | ICD-10-CM

## 2022-08-22 DIAGNOSIS — M5386 Other specified dorsopathies, lumbar region: Secondary | ICD-10-CM

## 2022-08-22 DIAGNOSIS — M5459 Other low back pain: Secondary | ICD-10-CM

## 2022-08-22 NOTE — Therapy (Signed)
OUTPATIENT PHYSICAL THERAPY THORACOLUMBAR TREATMENT/DISCHARGE   Patient Name: Timothy Delacruz MRN: [id number] DOB:03-25-1971, 52 y.o., male Today's Date: 08/22/2022  END OF SESSION:  PT End of Session - 08/22/22 0915     Visit Number 6    Number of Visits 24    Date for PT Re-Evaluation 10/11/22    PT Start Time 0915    PT Stop Time 0956    PT Time Calculation (min) 41 min    Activity Tolerance Patient tolerated treatment well    Behavior During Therapy [facility] for tasks assessed/performed                Past Medical History:  Diagnosis Date   Acute knee pain 10/22/2019   Hyperlipidemia    Hypertension    Loss or death of partner 04/17/13   Low testosterone 03/22/2013   Past Surgical History:  Procedure Laterality Date   COLONOSCOPY WITH PROPOFOL N/A 07/19/2019   Procedure: COLONOSCOPY WITH PROPOFOL;  Surgeon: Jonathon Bellows, MD;  Location: [facility] ENDOSCOPY;  Service: Gastroenterology;  Laterality: N/A;   LAPAROSCOPIC GASTRIC BANDING  02/2011   Patient Active Problem List   Diagnosis Date Noted   Strep throat 07/12/2022   Prediabetes 07/06/2022   High risk sexual behavior 07/06/2022   OSA (obstructive sleep apnea) 11/25/2021   Hypogonadism in male 09/30/2019   PVD (peripheral vascular disease) ([place]) 06/11/2019   Spinal stenosis 06/11/2019   Erectile dysfunction 2013-04-17   Epidermal cyst 01/14/2013   Encounter for annual general medical examination with abnormal findings in adult 07/28/2011   APL Lapband 03/01/11 06/15/2011   Cutaneous skin tags 03/14/2011   Hyperlipidemia 09/08/2010   GYNECOMASTIA, UNILATERAL 08/04/2010   OBESITY, MORBID 06/09/2009   Essential hypertension, benign 06/09/2009   OTHER DYSPHAGIA 06/09/2009    PCP: Alma Friendly, NP  REFERRING PROVIDER: Alma Friendly, NP  REFERRING DIAG:  [phone] (ICD-10-CM) - Spinal stenosis of lumbar region, unspecified whether neurogenic claudication present   Rationale for Evaluation and Treatment:  Rehabilitation  THERAPY DIAG:  Spinal stenosis of lumbar region, unspecified whether neurogenic claudication present  Other low back pain  Lumbar pain  Decreased ROM of lumbar spine  ONSET DATE: ~1 year  SUBJECTIVE:                                                                                                                                                                                           SUBJECTIVE STATEMENT:  Pt reports his mom is dealing with a stomach bug and that has complicated his schedule this week due to arranging for appointments to be performed.  Pt is ready to be discharged today.  PERTINENT HISTORY:   Pt reports he has been experiencing some increased pain in the lumbar spine and into both legs.  Pt has had experience with sciatica in the past.  Pt also has prior experience with PT and notes that the therapist gave him core exercises and told him that due to his weight, he would need to work on things at home to resolve the issue and that was all that was done for the most part.  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  Pt notes pain with sitting for prolonged period of time or heaviness in the LE's when he is walking for a while.  The sciatica acts out the most when in bed.    PAIN:  Are you having pain? 2/10  PRECAUTIONS: None  WEIGHT BEARING RESTRICTIONS: No  FALLS:  Has patient fallen in last 6 months? No  LIVING ENVIRONMENT: Lives with: lives alone  but mother is staying with pt currently due to having a bad bout of Covid. Lives in: Mobile home Stairs:  Pt had a ramp installed due to his mother staying with him, so he does not have to go up any stairs. 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    NEXT MD VISIT: Does not currently have a date.  OBJECTIVE:   DIAGNOSTIC FINDINGS:   CLINICAL DATA:  Initial evaluation for lower back pain radiating into the bilateral hips and lower extremities with intermittent numbness at lateral aspect of right leg.   EXAM: MRI LUMBAR SPINE WITHOUT CONTRAST   TECHNIQUE: Multiplanar, multisequence MR imaging of the lumbar spine was performed. No intravenous contrast was administered.   COMPARISON:  Prior radiographs from 07/19/2017.  IMPRESSION: 1. Disc bulge with prominent facet hypertrophy at L2-3 with resultant moderate to severe spinal stenosis. 2. Disc bulge with facet hypertrophy at L3-4 with resultant mild to moderate bilateral L3 foraminal stenosis, left greater than right. 3. Shallow central disc protrusion at L4-5 without significant stenosis or neural impingement.  PATIENT SURVEYS:  Modified Oswestry 7  FOTO 60/68  SCREENING FOR RED FLAGS: Bowel or bladder incontinence: No Spinal tumors: No Cauda equina syndrome: No Compression fracture: No Abdominal aneurysm: No  COGNITION: Overall cognitive status: Within functional limits for tasks assessed     SENSATION: WFL, pt does have some numbness along the ITB.   POSTURE: rounded shoulders and forward head  LUMBAR ROM:   AROM eval  Flexion   Extension   Right lateral flexion   Left lateral flexion   Right rotation   Left rotation    (Blank rows = not tested)  LOWER EXTREMITY ROM:     LE ROM limited due to body habitus at this time.  LOWER EXTREMITY MMT:    MMT Right eval Left eval  Hip flexion 5 4  Hip abduction 5 5  Hip adduction 5 4  Knee flexion 5 4  Knee extension 5 5  Ankle dorsiflexion 5 5   (Blank rows = not tested)  LUMBAR SPECIAL TESTS:  Slump test: Negative and FABER test:  Positive  FUNCTIONAL TESTS:  5 times sit to stand: 14.21 Timed up and go (TUG): 10.91 6 minute walk test: Defer to next visit  GAIT: Distance walked: 10' Assistive device utilized: None Level of assistance: Complete Independence Comments: Pt has abnormal gait pattern throughout the testing and will be monitored during subsequent visits.  TODAY'S TREATMENT: DATE: 08/22/22   Neuro:  Goal assessment performed and noted below:   PATIENT EDUCATION:  Education details: Pt educated on role of PT and services provided during current POC, along with prognosis and information about the clinic. Person educated: Patient Education method: Explanation Education comprehension: verbalized understanding  HOME EXERCISE PROGRAM:   Access Code: [email or web address] URL: [email or web address] Date: 08/15/2022 Prepared by: [place]  Exercises - Hamstring Stretch at [employer]  - 1 x daily - 7 x weekly - 3 sets - 10 reps - Standing Hip Abduction Adduction at [employer]  - 1 x daily - 7 x weekly - 3 sets - 10 reps - Standing Hip Flexion Extension at [employer]  - 1 x daily - 7 x weekly - 3 sets - 10 reps - Hip Flexor Stretch at [employer]  - 1 x daily - 7 x weekly - 3 sets - 10 reps - Squat  - 1 x daily - 7 x weekly - 3 sets - 10 reps - Flutter Kick at [employer]  - 1 x daily - 7 x weekly - 3 sets - 10 reps   Access Code: [email or web address] URL: [email or web address] Date: 07/19/2022 Prepared by: [place]  Exercises - Supine Sciatic Nerve Glide  - 1 x daily - 7 x weekly - 3 sets - 10 reps - Standing Distal Sciatic Nerve Mobilization on Step  - 1 x daily - 7 x weekly - 3 sets - 10 reps - Standing Sciatic Nerve Mobilization on Step  - 1 x daily - 7 x weekly - 3 sets - 10 reps - Seated Sciatic Tensioner  - 1 x daily - 7 x weekly - 3 sets - 10 reps - Seated Slump Nerve Glide  - 1 x daily - 7 x weekly - 3 sets - 10 reps - Supine ITB Stretch with Strap  - 1 x daily - 7 x weekly - 3  sets - 10 reps - Standing ITB Stretch  - 1 x daily - 7 x weekly - 3 sets - 10 reps    ASSESSMENT:  CLINICAL IMPRESSION:  Pt has made significant progress towards goals at this time.  Pt achieved the same 6MWT time, but was limited due to the pain experienced in the R hip/gluteal region.  Pt improved on all other goals and self-reports making improved progress with his back pain and endurance levels.  Pt encouraged to continue with aquatic therapy classes and to continue with land-based exercises that will strengthen his core and musculature in order to reduce pain.  Pt agreeable to discharge at this time and encouraged to reach out to clinic or clinician for any direction/questions that arise.         OBJECTIVE IMPAIRMENTS: Abnormal gait, decreased activity tolerance, decreased endurance, decreased mobility, difficulty walking, decreased ROM, decreased strength, impaired flexibility, and obesity.   ACTIVITY LIMITATIONS: carrying, lifting, bending, standing, squatting, sleeping, stairs, bed mobility, locomotion level, and caring for others  PARTICIPATION LIMITATIONS:  pt is able to do most things, but has difficulty with   PERSONAL FACTORS: Fitness, Past/current experiences, Profession, Time since onset of injury/illness/exacerbation, and 3+ comorbidities: obesity, spinal  stenosis, PVD, HTN  are also affecting patient's functional outcome.   REHAB POTENTIAL: Fair , obesity and lack of mobility may have difficulty with exercises given.  Pt   CLINICAL DECISION MAKING: Stable/uncomplicated  EVALUATION COMPLEXITY: Moderate   GOALS: Goals reviewed with patient? Yes  SHORT TERM GOALS: Target date: 08/16/2022  Pt will be independent with HEP in order to demonstrate increased ability to perform tasks related to occupation/hobbies. Baseline: Given HEP at initial evaluation Goal status: MET  LONG TERM GOALS: Target date: 10/11/2022  1.  Patient will complete five times sit to stand test  in <10 seconds indicating an increased LE strength and improved balance. Baseline: 14.21 sec 08/22/22: 13.60 sec Goal status: INITIAL  2.  Patient will increase FOTO score to equal to or greater than 68 to demonstrate statistically significant improvement in mobility and quality of life.  Baseline: FOTO: 60 08/22/22: 67 Goal status: IN PROGRESS   3.  Patient will reduce timed up and go to <10 seconds to reduce fall risk and demonstrate improved transfer/gait ability. Baseline: 10.91 sec 08/22/22: 8.91 sec Goal status: INITIAL  4.  Patient will increase six minute walk test distance to >1000 for progression to community ambulator and improve gait ability Baseline: 809' w/o AD 08/22/22: 809' w/o AD Goal status: INITIAL    PLAN:  PT FREQUENCY: 1x/week  PT DURATION: 12 weeks  PLANNED INTERVENTIONS: Therapeutic exercises, Therapeutic activity, Neuromuscular re-education, Balance training, Gait training, Patient/Family education, Self Care, Joint mobilization, Joint manipulation, Stair training, Vestibular training, DME instructions, Aquatic Therapy, Dry Needling, Spinal manipulation, Spinal mobilization, Cryotherapy, Moist heat, Traction, and Manual therapy.  PLAN FOR NEXT SESSION:   D/C at this time.   Gwenlyn Saran, PT, DPT Physical Therapist- [facility]  08/22/22, 10:06 AM

## 2022-08-26 ENCOUNTER — Ambulatory Visit (INDEPENDENT_AMBULATORY_CARE_PROVIDER_SITE_OTHER): Payer: 59 | Admitting: Adult Health

## 2022-08-26 ENCOUNTER — Encounter: Payer: Self-pay | Admitting: Adult Health

## 2022-08-26 VITALS — BP 130/74 | HR 105 | Temp 97.8°F | Ht 71.0 in | Wt >= 6400 oz

## 2022-08-26 DIAGNOSIS — G4733 Obstructive sleep apnea (adult) (pediatric): Secondary | ICD-10-CM | POA: Diagnosis not present

## 2022-08-26 NOTE — Patient Instructions (Signed)
Continue on CPAP at bedtime and with naps Goal is to wear your CPAP all night long, for at least 6 hours or more Healthy sleep regimen Do not drive if sleepy Work on healthy weight loss Follow-up in 1 year and As needed

## 2022-08-26 NOTE — Progress Notes (Signed)
$'@Patient'w$  ID: Timothy Delacruz, male    DOB: 1970-08-04, 52 y.o.   MRN: [id number]  Chief Complaint  Patient presents with   Follow-up    Referring provider: Pleas Koch, NP  HPI: 52 year old male seen for sleep consult September 15, 2021 and found to have very severe sleep apnea   TEST/EVENTS :  Split night  Nov 10, 2021 showed very severe sleep apnea with a AHI at 80.1/hour, AHI while supine 111/hour, (no CPAP titration was done)   08/26/2022 Follow up: OSA  Patient returns for 9-monthfollow-up.  Patient was seen in March 2023 for sleep consult found to have very severe sleep apnea.  He was started on CPAP.  Patient says he is doing very well on CPAP.  Feels very rested and that he benefits from CPAP.  Patient says he wears a CPAP every single night cannot sleep without it.  CPAP download shows excellent compliance with 100% usage.  Daily average usage at 7.5 hours.  Patient is on auto CPAP 5 to 15 cm H2O.  AHI 2.4/hour  Allergies  Allergen Reactions   Lisinopril     REACTION: cough    Immunization History  Administered Date(s) Administered   Influenza,inj,Quad PF,6+ Mos 06/30/2021, 07/06/2022   Influenza-Unspecified 04/08/2019   PFIZER(Purple Top)SARS-COV-2 Vaccination 09/13/2019, 10/08/2019   Td 06/09/2009   Tdap 06/13/2019   Zoster Recombinat (Shingrix) 04/08/2019, 06/13/2019    Past Medical History:  Diagnosis Date   Acute knee pain 10/22/2019   Hyperlipidemia    Hypertension    Loss or death of partner 010-23-2014  Low testosterone 03/22/2013    Tobacco History: Social History   Tobacco Use  Smoking Status Never  Smokeless Tobacco Never   Counseling given: Not Answered   Outpatient Medications Prior to Visit  Medication Sig Dispense Refill   calcium citrate-vitamin D 200-200 MG-UNIT TABS Take 4 tablets by mouth daily.       diclofenac Sodium (VOLTAREN) 1 % GEL Apply 2 g topically 4 (four) times daily.     doxycycline (ADOXA) 100 MG tablet Take 2  tablets (200 mg total) by mouth once as needed for up to 20 doses. 20 tablet 0   hydrochlorothiazide (HYDRODIURIL) 25 MG tablet Take 1 tablet (25 mg total) by mouth daily. For blood pressure. 90 tablet 0   magnesium 30 MG tablet Take 30 mg by mouth 2 (two) times daily.     Multiple Vitamins-Minerals (MULTIVITAMIN WITH MINERALS) tablet Take 1 tablet by mouth daily.       POTASSIUM PO Take by mouth.     Emtricitabine-Tenofovir AF (DESCOVY) 120-15 MG TABS Take 1 tablet by mouth daily. 30 tablet 2   No facility-administered medications prior to visit.     Review of Systems:   Constitutional:   No  weight loss, night sweats,  Fevers, chills, fatigue, or  lassitude.  HEENT:   No headaches,  Difficulty swallowing,  Tooth/dental problems, or  Sore throat,                No sneezing, itching, ear ache, nasal congestion, post nasal drip,   CV:  No chest pain,  Orthopnea, PND, swelling in lower extremities, anasarca, dizziness, palpitations, syncope.   GI  No heartburn, indigestion, abdominal pain, nausea, vomiting, diarrhea, change in bowel habits, loss of appetite, bloody stools.   Resp: No shortness of breath with exertion or at rest.  No excess mucus, no productive cough,  No non-productive cough,  No coughing up  of blood.  No change in color of mucus.  No wheezing.  No chest wall deformity  Skin: no rash or lesions.  GU: no dysuria, change in color of urine, no urgency or frequency.  No flank pain, no hematuria   MS:  No joint pain or swelling.  No decreased range of motion.  No back pain.    Physical Exam  BP 130/74 (BP Location: Left Arm, Patient Position: Sitting, Cuff Size: Large)   Pulse (!) 105   Temp 97.8 F (36.6 C) (Oral)   Ht '5\' 11"'$  (1.803 m)   Wt (!) 456 lb 3.2 oz (206.9 kg)   SpO2 100%   BMI 63.63 kg/m   GEN: A/Ox3; pleasant , NAD, well nourished    HEENT:  [place]/AT,   NOSE-clear, THROAT-clear, no lesions, no postnasal drip or exudate noted.  Class IV MP  airway  NECK:  Supple w/ fair ROM; no JVD; normal carotid impulses w/o bruits; no thyromegaly or nodules palpated; no lymphadenopathy.    RESP  Clear  P & A; w/o, wheezes/ rales/ or rhonchi. no accessory muscle use, no dullness to percussion  CARD:  RRR, no m/r/g, no peripheral edema, pulses intact, no cyanosis or clubbing.  GI:   Soft & nt; nml bowel sounds; no organomegaly or masses detected.   Musco: Warm bil, no deformities or joint swelling noted.   Neuro: alert, no focal deficits noted.    Skin: Warm, no lesions or rashes    Lab Results:  CBC    Component Value Date/Time   WBC 8.3 07/02/2021 1306   RBC 4.91 07/02/2021 1306   HGB 14.3 07/02/2021 1306   HCT 43.2 07/02/2021 1306   HCT 45.6 05/13/2020 1425   PLT 261.0 07/02/2021 1306   MCV 88.1 07/02/2021 1306   MCH 28.4 03/02/2011 0455   MCHC 33.1 07/02/2021 1306   RDW 14.0 07/02/2021 1306   LYMPHSABS 1.9 03/02/2011 0455   MONOABS 0.9 03/02/2011 0455   EOSABS 0.1 03/02/2011 0455   BASOSABS 0.0 03/02/2011 0455    BMET    Component Value Date/Time   NA 138 07/06/2022 1059   K 3.9 07/06/2022 1059   CL 100 07/06/2022 1059   CO2 27 07/06/2022 1059   GLUCOSE 96 07/06/2022 1059   BUN 11 07/06/2022 1059   CREATININE 0.71 07/06/2022 1059   CREATININE 0.84 12/15/2010 0941   CALCIUM 9.3 07/06/2022 1059   GFRNONAA >60 02/22/2011 1030   GFRAA >60 02/22/2011 1030    BNP No results found for: "BNP"  ProBNP No results found for: "PROBNP"  Imaging: No results found.        No data to display          No results found for: "NITRICOXIDE"      Assessment & Plan:   OSA (obstructive sleep apnea) Severe obstructive sleep apnea with excellent control compliance on nocturnal CPAP  Plan  Patient Instructions  Continue on CPAP at bedtime and with naps Goal is to wear your CPAP all night long, for at least 6 hours or more Healthy sleep regimen Do not drive if sleepy Work on healthy weight  loss Follow-up in 1 year and As needed      OBESITY, MORBID Healthy weight loss     Rexene Edison, NP 08/26/2022

## 2022-08-26 NOTE — Assessment & Plan Note (Signed)
Severe obstructive sleep apnea with excellent control compliance on nocturnal CPAP  Plan  Patient Instructions  Continue on CPAP at bedtime and with naps Goal is to wear your CPAP all night long, for at least 6 hours or more Healthy sleep regimen Do not drive if sleepy Work on healthy weight loss Follow-up in 1 year and As needed

## 2022-08-26 NOTE — Assessment & Plan Note (Signed)
Healthy weight loss 

## 2022-08-27 NOTE — Progress Notes (Signed)
Reviewed and agree with assessment/plan.   Chesley Mires, MD [facility] 08/27/2022, 1:17 PM Pager:  [phone]

## 2022-08-29 ENCOUNTER — Ambulatory Visit: Payer: 59

## 2022-09-05 ENCOUNTER — Ambulatory Visit: Payer: 59

## 2022-09-07 ENCOUNTER — Telehealth: Payer: Self-pay

## 2022-09-07 NOTE — Telephone Encounter (Signed)
Received call from patient to cancel his 3/19 appointment, states he cannot afford the medication and is exploring other avenues. Offered to have our pharmacy tech look into copay assistance for him, he politely declines. Encouraged him to reach back out and reschedule if he changes his mind.   Beryle Flock, RN

## 2022-09-12 ENCOUNTER — Ambulatory Visit: Payer: 59

## 2022-09-13 ENCOUNTER — Inpatient Hospital Stay: Payer: 59 | Admitting: Infectious Diseases

## 2022-09-16 ENCOUNTER — Other Ambulatory Visit: Payer: Self-pay | Admitting: Primary Care

## 2022-09-16 DIAGNOSIS — I10 Essential (primary) hypertension: Secondary | ICD-10-CM

## 2022-10-06 ENCOUNTER — Ambulatory Visit (INDEPENDENT_AMBULATORY_CARE_PROVIDER_SITE_OTHER): Payer: 59 | Admitting: Primary Care

## 2022-10-06 ENCOUNTER — Encounter: Payer: Self-pay | Admitting: Primary Care

## 2022-10-06 DIAGNOSIS — I10 Essential (primary) hypertension: Secondary | ICD-10-CM

## 2022-10-06 MED ORDER — HYDROCHLOROTHIAZIDE 25 MG PO TABS: ORAL_TABLET | ORAL | 2 refills | Status: DC

## 2022-10-06 NOTE — Assessment & Plan Note (Signed)
Controlled.  Continue HCTZ 25 mg. Refills sent to preferred pharmacy at [facility].  Follow up in January 2025.

## 2022-10-06 NOTE — Progress Notes (Signed)
Subjective:    Patient ID: Timothy Delacruz, male    DOB: 11-15-1970, 52 y.o.   MRN: [id number]  HPI  Timothy Delacruz is a very pleasant 52 y.o. male with a history of hypertension, morbid obesity, OSA, PVD, hypogonadism who presents today for refills of HCTZ.  Evaluated for CPE in January 2024. Managed on hydrochlorothiazide 25 g daily. He has recently switched to [employer] as his insurance will not cover.   He's been out of HCTZ about 5 days. He does check his BP at home which runs 120's/130's-70's. He denies dizziness, headaches.   BP Readings from Last 3 Encounters:  10/06/22 124/76  08/26/22 130/74  07/14/22 135/79    Wt Readings from Last 3 Encounters:  10/06/22 (!) 467 lb (211.8 kg)  08/26/22 (!) 456 lb 3.2 oz (206.9 kg)  07/14/22 (!) 461 lb (209.1 kg)       Review of Systems  Respiratory:  Negative for shortness of breath.   Cardiovascular:  Negative for chest pain.  Neurological:  Negative for dizziness and headaches.         Past Medical History:  Diagnosis Date   Acute knee pain 10/22/2019   Hyperlipidemia    Hypertension    Loss or death of partner 2013-04-15   Low testosterone 03/22/2013    Social History   Socioeconomic History   Marital status: Single    Spouse name: Not on file   Number of children: Not on file   Years of education: Not on file   Highest education level: Associate degree: academic program  Occupational History   Not on file  Tobacco Use   Smoking status: Never   Smokeless tobacco: Never  Vaping Use   Vaping Use: Never used  Substance and Sexual Activity   Alcohol use: Yes    Comment: occ   Drug use: No   Sexual activity: Not Currently    Partners: Male    Comment: no condoms  Other Topics Concern   Not on file  Social History Narrative   Not on file   Social Determinants of Health   Financial Resource Strain: Low Risk  (10/02/2022)   Overall Financial Resource Strain (CARDIA)    Difficulty of Paying  Living Expenses: Not hard at all  Food Insecurity: No Food Insecurity (10/02/2022)   Hunger Vital Sign    Worried About Running Out of Food in the Last Year: Never true    Ran Out of Food in the Last Year: Never true  Transportation Needs: No Transportation Needs (10/02/2022)   PRAPARE - Administrator, Civil Service (Medical): No    Lack of Transportation (Non-Medical): No  Physical Activity: Insufficiently Active (10/02/2022)   Exercise Vital Sign    Days of Exercise per Week: 3 days    Minutes of Exercise per Session: 40 min  Stress: No Stress Concern Present (10/02/2022)   [employer] of Occupational Health - Occupational Stress Questionnaire    Feeling of Stress : Only a little  Social Connections: Socially Isolated (10/02/2022)   Social Connection and Isolation Panel [NHANES]    Frequency of Communication with Friends and Family: More than three times a week    Frequency of Social Gatherings with Friends and Family: Once a week    Attends Religious Services: Never    Database administrator or Organizations: No    Attends Engineer, structural: Not on file    Marital Status: Never married  Intimate Partner Violence: Not on file    Past Surgical History:  Procedure Laterality Date   COLONOSCOPY WITH PROPOFOL N/A 07/19/2019   Procedure: COLONOSCOPY WITH PROPOFOL;  Surgeon: Wyline Mood, MD;  Location: [facility];  Service: Gastroenterology;  Laterality: N/A;   LAPAROSCOPIC GASTRIC BANDING  02/2011    Family History  Problem Relation Age of Onset   Cancer Father        prostate   Heart attack Paternal Grandmother    Cancer Paternal Grandfather        Cancer   Cancer Paternal Aunt        lung    Allergies  Allergen Reactions   Lisinopril     REACTION: cough    Current Outpatient Medications on File Prior to Visit  Medication Sig Dispense Refill   calcium citrate-vitamin D 200-200 MG-UNIT TABS Take 4 tablets by mouth daily.       diclofenac Sodium  (VOLTAREN) 1 % GEL Apply 2 g topically 4 (four) times daily.     magnesium 30 MG tablet Take 30 mg by mouth 2 (two) times daily.     Multiple Vitamins-Minerals (MULTIVITAMIN WITH MINERALS) tablet Take 1 tablet by mouth daily.       POTASSIUM PO Take by mouth.     doxycycline (ADOXA) 100 MG tablet Take 2 tablets (200 mg total) by mouth once as needed for up to 20 doses. (Patient not taking: Reported on 10/06/2022) 20 tablet 0   No current facility-administered medications on file prior to visit.    BP 124/76   Pulse 69   Temp (!) 97.2 F (36.2 C) (Temporal)   Ht 5\' 11"  (1.803 m)   Wt (!) 467 lb (211.8 kg)   SpO2 98%   BMI 65.13 kg/m  Objective:   Physical Exam Cardiovascular:     Rate and Rhythm: Normal rate and regular rhythm.  Pulmonary:     Effort: Pulmonary effort is normal.     Breath sounds: Normal breath sounds. No wheezing or rales.  Musculoskeletal:     Cervical back: Neck supple.  Skin:    General: Skin is warm and dry.  Neurological:     Mental Status: He is alert and oriented to person, place, and time.           Assessment & Plan:  Essential hypertension, benign Assessment & Plan: Controlled.  Continue HCTZ 25 mg. Refills sent to preferred pharmacy at [facility].  Follow up in January 2025.  Orders: -     hydroCHLOROthiazide; TAKE 1 TABLET BY MOUTH EVERY DAY FOR BLOOD PRESSURE  Dispense: 90 tablet; Refill: 2        Doreene Nest, NP

## 2022-11-23 ENCOUNTER — Ambulatory Visit (INDEPENDENT_AMBULATORY_CARE_PROVIDER_SITE_OTHER): Payer: 59 | Admitting: Primary Care

## 2022-11-23 ENCOUNTER — Encounter: Payer: Self-pay | Admitting: Primary Care

## 2022-11-23 VITALS — BP 136/82 | HR 70 | Temp 97.9°F | Ht 71.0 in | Wt >= 6400 oz

## 2022-11-23 DIAGNOSIS — M25521 Pain in right elbow: Secondary | ICD-10-CM

## 2022-11-23 DIAGNOSIS — M25529 Pain in unspecified elbow: Secondary | ICD-10-CM | POA: Insufficient documentation

## 2022-11-23 DIAGNOSIS — M79641 Pain in right hand: Secondary | ICD-10-CM | POA: Diagnosis not present

## 2022-11-23 LAB — C-REACTIVE PROTEIN: CRP: 1.9 mg/dL (ref 0.5–20.0)

## 2022-11-23 LAB — URIC ACID: Uric Acid, Serum: 7.6 mg/dL (ref 4.0–7.8)

## 2022-11-23 LAB — SEDIMENTATION RATE: Sed Rate: 79 mm/hr — ABNORMAL HIGH (ref 0–20)

## 2022-11-23 NOTE — Assessment & Plan Note (Signed)
Exam and HPI consistent with osteoarthritis, especially given long history of overuse.  Will check labs today to rule out autoimmune arthritis and gout.  Discussed to limit overuse of the right hand/finger. Switch from Tylenol to Aleve twice daily for several days.  Consider prednisone if no improvement.

## 2022-11-23 NOTE — Assessment & Plan Note (Signed)
Likely secondary to arthritis.  Checking labs today to rule out autoimmune arthritis and gout.  Discussed to switch to Aleve twice daily for the next several days. Also discussed use of Voltaren gel.  He will update if no improvement.  Consider prednisone at that point.

## 2022-11-23 NOTE — Progress Notes (Signed)
Subjective:    Patient ID: Timothy Delacruz, male    DOB: [birth year], 52 y.o.   MRN: [id number]  Hand Pain     Timothy Delacruz is a very pleasant 52 y.o. male with a history of morbid obesity, spinal stenosis, epidermal cyst who presents today to discuss several concerns.  1) Hand Pain: Acute x 1 week.  Symptoms are located to the right first digit at the metacarpal joint.  He has noticed inability to completely straighten or flex his right first finger.  He has noticed difficulty with grasping objects and picking up objects due to his pain.   He has been able to type on a computer but his finger really bothers him with opening containers or applying pressure. Overall his pain has progressed.   He denies overuse of his right hand/finger, injury/trauma. 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 He does notice chronic and intermittent numbness to the right wrist down to his hand.   2) Elbow Pain: Acute.  Symptom onset 2 days ago with a sudden "pop" sensation to the right elbow.  Since then he cannot fully extend his right upper extremity without pain.  He has noticed swelling and radiation of pain to his right wrist.   Pain is worse with elbow flexion, especially when eating.   He has tried taking Tylenol, applying Voltaren gel and IcyHot.  He denies shoulder pain.    Review of Systems  Musculoskeletal:  Positive for arthralgias and joint swelling.  Skin:  Negative for color change.         Past Medical History:  Diagnosis Date   Acute knee pain 10/22/2019   Hyperlipidemia    Hypertension    Loss or death of partner March 27, 2013   Low testosterone 03/22/2013    Social History   Socioeconomic History   Marital status: Single    Spouse name: Not on file   Number of children: Not on file   Years of education: Not on file   Highest education level: Associate degree: academic program  Occupational  History   Not on file  Tobacco Use   Smoking status: Never   Smokeless tobacco: Never  Vaping Use   Vaping Use: Never used  Substance and Sexual Activity   Alcohol use: Yes    Comment: occ   Drug use: No   Sexual activity: Not Currently    Partners: Male    Comment: no condoms  Other Topics Concern   Not on file  Social History Narrative   Not on file   Social Determinants of Health   Financial Resource Strain: Low Risk  (10/02/2022)   Overall Financial Resource Strain (CARDIA)    Difficulty of Paying Living Expenses: Not hard at all  Food Insecurity: No Food Insecurity (10/02/2022)   Hunger Vital Sign    Worried About Running Out of Food in the Last Year: Never true    Ran Out of Food in the Last Year: Never true  Transportation Needs: No Transportation Needs (10/02/2022)   PRAPARE - Administrator, Civil Service (Medical): No    Lack of Transportation (Non-Medical): No  Physical Activity: Insufficiently Active (10/02/2022)   Exercise Vital Sign    Days of Exercise per Week: 3 days    Minutes of Exercise per Session: 40 min  Stress: No Stress Concern Present (10/02/2022)   [employer] of Occupational Health -  Occupational Stress Questionnaire    Feeling of Stress : Only a little  Social Connections: Socially Isolated (10/02/2022)   Social Connection and Isolation Panel [NHANES]    Frequency of Communication with Friends and Family: More than three times a week    Frequency of Social Gatherings with Friends and Family: Once a week    Attends Religious Services: Never    Database administrator or Organizations: No    Attends Engineer, structural: Not on file    Marital Status: Never married  Intimate Partner Violence: Not on file    Past Surgical History:  Procedure Laterality Date   COLONOSCOPY WITH PROPOFOL N/A 07/19/2019   Procedure: COLONOSCOPY WITH PROPOFOL;  Surgeon: Wyline Mood, MD;  Location: [facility] ENDOSCOPY;  Service: Gastroenterology;   Laterality: N/A;   LAPAROSCOPIC GASTRIC BANDING  02/2011    Family History  Problem Relation Age of Onset   Cancer Father        prostate   Heart attack Paternal Grandmother    Cancer Paternal Grandfather        Cancer   Cancer Paternal Aunt        lung    Allergies  Allergen Reactions   Lisinopril     REACTION: cough    Current Outpatient Medications on File Prior to Visit  Medication Sig Dispense Refill   calcium citrate-vitamin D 200-200 MG-UNIT TABS Take 4 tablets by mouth daily.       diclofenac Sodium (VOLTAREN) 1 % GEL Apply 2 g topically 4 (four) times daily.     hydrochlorothiazide (HYDRODIURIL) 25 MG tablet TAKE 1 TABLET BY MOUTH EVERY DAY FOR BLOOD PRESSURE 90 tablet 2   magnesium 30 MG tablet Take 30 mg by mouth 2 (two) times daily.     Multiple Vitamins-Minerals (MULTIVITAMIN WITH MINERALS) tablet Take 1 tablet by mouth daily.       POTASSIUM PO Take by mouth.     No current facility-administered medications on file prior to visit.    BP 136/82   Pulse 70   Temp 97.9 F (36.6 C) (Temporal)   Ht 5\' 11"  (1.803 m)   Wt (!) 460 lb (208.7 kg)   SpO2 99%   BMI 64.16 kg/m  Objective:   Physical Exam Musculoskeletal:     Right shoulder: Normal range of motion.     Right elbow: No swelling or deformity. Decreased range of motion. No tenderness.     Right hand: Swelling present. No tenderness. Decreased range of motion.     Comments: Decrease in range of motion with full flexion of right elbow due to pain.   Mild swelling noted to entire right first digit.  Decrease in range of motion to right first metacarpal joint and right PIP joint.  Skin:    General: Skin is warm and dry.     Findings: No erythema.           Assessment & Plan:  Right elbow pain Assessment & Plan: Likely secondary to arthritis.  Checking labs today to rule out autoimmune arthritis and gout.  Discussed to switch to Aleve twice daily for the next several days. Also discussed  use of Voltaren gel.  He will update if no improvement.  Consider prednisone at that point.  Orders: -     Uric acid -     Sedimentation rate -     C-reactive protein -     Cyclic citrul peptide antibody, IgG -  Rheumatoid factor  Right hand pain Assessment & Plan: Exam and HPI consistent with osteoarthritis, especially given long history of overuse.  Will check labs today to rule out autoimmune arthritis and gout.  Discussed to limit overuse of the right hand/finger. Switch from Tylenol to Aleve twice daily for several days.  Consider prednisone if no improvement.  Orders: -     Uric acid -     Sedimentation rate -     C-reactive protein -     Cyclic citrul peptide antibody, IgG -     Rheumatoid factor        Doreene Nest, NP

## 2022-11-23 NOTE — Patient Instructions (Signed)
Stop by the lab prior to leaving today. I will notify you of your results once received.   Switch to Aleve.  Take 1 tablet twice daily for the next several days.  Apply the Voltaren gel to your elbow as discussed.  Please update me!

## 2022-11-24 DIAGNOSIS — M2550 Pain in unspecified joint: Secondary | ICD-10-CM

## 2022-11-24 DIAGNOSIS — R768 Other specified abnormal immunological findings in serum: Secondary | ICD-10-CM

## 2022-11-25 LAB — RHEUMATOID FACTOR: Rheumatoid fact SerPl-aCnc: 119 IU/mL — ABNORMAL HIGH (ref <14)

## 2022-11-25 LAB — CYCLIC CITRUL PEPTIDE ANTIBODY, IGG: Cyclic Citrullin Peptide Ab: 245 UNITS — ABNORMAL HIGH

## 2022-12-08 ENCOUNTER — Ambulatory Visit: Payer: 59 | Admitting: Primary Care

## 2022-12-21 DIAGNOSIS — M2550 Pain in unspecified joint: Secondary | ICD-10-CM

## 2022-12-23 MED ORDER — PREDNISONE 20 MG PO TABS: ORAL_TABLET | ORAL | 0 refills | Status: DC

## 2023-03-09 ENCOUNTER — Ambulatory Visit: Payer: 59 | Admitting: Primary Care

## 2023-03-13 ENCOUNTER — Ambulatory Visit: Payer: 59 | Admitting: Primary Care

## 2023-04-05 ENCOUNTER — Ambulatory Visit (INDEPENDENT_AMBULATORY_CARE_PROVIDER_SITE_OTHER): Payer: 59 | Admitting: Primary Care

## 2023-04-05 ENCOUNTER — Encounter: Payer: Self-pay | Admitting: Primary Care

## 2023-04-05 VITALS — BP 132/78 | HR 76 | Temp 98.2°F | Ht 71.0 in | Wt >= 6400 oz

## 2023-04-05 DIAGNOSIS — T148XXA Other injury of unspecified body region, initial encounter: Secondary | ICD-10-CM | POA: Insufficient documentation

## 2023-04-05 DIAGNOSIS — L729 Follicular cyst of the skin and subcutaneous tissue, unspecified: Secondary | ICD-10-CM | POA: Diagnosis not present

## 2023-04-05 NOTE — Assessment & Plan Note (Signed)
Exam today most consistent for open skin cyst.  No apparent infection.   Wound culture collected and is pending.  Discussed methods for continued drainage. Discussed signs/symptoms of infection and when to report.   Await results.

## 2023-04-05 NOTE — Patient Instructions (Signed)
Allow the site to drain is much as possible.  Please notify me if you develop pain, redness to the skin, stiffness to the skin.  I will be in touch as soon as I have the results from your swab.  It was a pleasure to see you today!

## 2023-04-05 NOTE — Progress Notes (Signed)
Subjective:    Patient ID: Timothy Delacruz, male    DOB: 28-Dec-1970, 52 y.o.   MRN: [id number]  HPI  Timothy Delacruz is a very pleasant 52 y.o. male with a history of hypertension, PVD, OSA, hypogonadism, epidermal cyst, prediabetes, high risk sexual behavior who presents today to discuss skin blisters/swelling.  His skin blister/swelling is located to the right groin for which he initially noticed about 1 month ago. The blister enlarged, popped with red clotted fluid, and improved. About 5 days ago he began noticing a "pinching sensation" to the area. Two days ago he noticed the site pop with pink, red, clear fluid.   He denies pain, but does notice a "sting". He denies fevers. He has kept the site clean.   He has a long history of these sores in the past to the lower extremities and abdomen for which he has always called "blood blisters".    Review of Systems  Constitutional:  Negative for fever.  Genitourinary:  Negative for penile pain, penile swelling, scrotal swelling and testicular pain.  Skin:  Positive for color change and wound.         Past Medical History:  Diagnosis Date   Acute knee pain 10/22/2019   Hyperlipidemia    Hypertension    Loss or death of partner 2013/03/28   Low testosterone 03/22/2013    Social History   Socioeconomic History   Marital status: Single    Spouse name: Not on file   Number of children: Not on file   Years of education: Not on file   Highest education level: Associate degree: academic program  Occupational History   Not on file  Tobacco Use   Smoking status: Never   Smokeless tobacco: Never  Vaping Use   Vaping status: Never Used  Substance and Sexual Activity   Alcohol use: Yes    Comment: occ   Drug use: No   Sexual activity: Not Currently    Partners: Male    Comment: no condoms  Other Topics Concern   Not on file  Social History Narrative   Not on file   Social Determinants of Health   Financial Resource  Strain: Low Risk  (10/02/2022)   Overall Financial Resource Strain (CARDIA)    Difficulty of Paying Living Expenses: Not hard at all  Food Insecurity: No Food Insecurity (10/02/2022)   Hunger Vital Sign    Worried About Running Out of Food in the Last Year: Never true    Ran Out of Food in the Last Year: Never true  Transportation Needs: No Transportation Needs (10/02/2022)   PRAPARE - Administrator, Civil Service (Medical): No    Lack of Transportation (Non-Medical): No  Physical Activity: Insufficiently Active (10/02/2022)   Exercise Vital Sign    Days of Exercise per Week: 3 days    Minutes of Exercise per Session: 40 min  Stress: No Stress Concern Present (10/02/2022)   [employer] of Occupational Health - Occupational Stress Questionnaire    Feeling of Stress : Only a little  Social Connections: Socially Isolated (10/02/2022)   Social Connection and Isolation Panel [NHANES]    Frequency of Communication with Friends and Family: More than three times a week    Frequency of Social Gatherings with Friends and Family: Once a week    Attends Religious Services: Never    Database administrator or Organizations: No    Attends Banker Meetings: Not on file  Marital Status: Never married  Intimate Partner Violence: Not on file    Past Surgical History:  Procedure Laterality Date   COLONOSCOPY WITH PROPOFOL N/A 07/19/2019   Procedure: COLONOSCOPY WITH PROPOFOL;  Surgeon: Wyline Mood, MD;  Location: [facility] ENDOSCOPY;  Service: Gastroenterology;  Laterality: N/A;   LAPAROSCOPIC GASTRIC BANDING  02/2011    Family History  Problem Relation Age of Onset   Cancer Father        prostate   Heart attack Paternal Grandmother    Cancer Paternal Grandfather        Cancer   Cancer Paternal Aunt        lung    Allergies  Allergen Reactions   Lisinopril     REACTION: cough    Current Outpatient Medications on File Prior to Visit  Medication Sig Dispense Refill    calcium citrate-vitamin D 200-200 MG-UNIT TABS Take 4 tablets by mouth daily.       diclofenac Sodium (VOLTAREN) 1 % GEL Apply 2 g topically 4 (four) times daily.     folic acid (FOLVITE) 1 MG tablet Take 1 mg by mouth daily.     hydrochlorothiazide (HYDRODIURIL) 25 MG tablet TAKE 1 TABLET BY MOUTH EVERY DAY FOR BLOOD PRESSURE 90 tablet 2   magnesium 30 MG tablet Take 30 mg by mouth 2 (two) times daily.     methotrexate (RHEUMATREX) 2.5 MG tablet Take 15 mg by mouth once a week.     Multiple Vitamins-Minerals (MULTIVITAMIN WITH MINERALS) tablet Take 1 tablet by mouth daily.       POTASSIUM PO Take by mouth.     predniSONE (DELTASONE) 20 MG tablet Take three tablets my mouth once daily in the morning for 3 days, then two tablets for 3 days, then one tablet for 3 days. (Patient not taking: Reported on 04/05/2023) 18 tablet 0   No current facility-administered medications on file prior to visit.    BP 132/78   Pulse 76   Temp 98.2 F (36.8 C) (Temporal)   Ht 5\' 11"  (1.803 m)   Wt (!) 437 lb (198.2 kg)   SpO2 98%   BMI 60.95 kg/m  Objective:   Physical Exam Constitutional:      General: He is not in acute distress. Cardiovascular:     Rate and Rhythm: Normal rate.  Pulmonary:     Effort: Pulmonary effort is normal.  Skin:    General: Skin is warm.     Comments: Small opening to right inguinal region on the genital side with bright red, pink, drainage, and small white particles. No surrounding erythema. No underlying abscess.   Chaperone present.           Assessment & Plan:  Skin cyst Assessment & Plan: Exam today most consistent for open skin cyst.  No apparent infection.   Wound culture collected and is pending.  Discussed methods for continued drainage. Discussed signs/symptoms of infection and when to report.   Await results.   Orders: -     Anaerobic and Aerobic Culture        Doreene Nest, NP

## 2023-04-12 LAB — ANAEROBIC AND AEROBIC CULTURE
MICRO NUMBER:: 15572850
SPECIMEN QUALITY:: ADEQUATE

## 2023-05-01 ENCOUNTER — Other Ambulatory Visit: Payer: Self-pay | Admitting: Medical Genetics

## 2023-05-01 DIAGNOSIS — Z006 Encounter for examination for normal comparison and control in clinical research program: Secondary | ICD-10-CM

## 2023-05-10 ENCOUNTER — Other Ambulatory Visit: Payer: 59

## 2023-05-17 ENCOUNTER — Other Ambulatory Visit: Payer: Self-pay | Admitting: Primary Care

## 2023-05-17 DIAGNOSIS — I10 Essential (primary) hypertension: Secondary | ICD-10-CM

## 2023-05-18 NOTE — Telephone Encounter (Signed)
Patient scheduled.

## 2023-05-18 NOTE — Telephone Encounter (Signed)
Patient is due for CPE/follow up in mid January 2025, this will be required prior to any further refills.  Please schedule, thank you!

## 2023-06-29 ENCOUNTER — Other Ambulatory Visit
Admission: RE | Admit: 2023-06-29 | Discharge: 2023-06-29 | Disposition: A | Discharge status: Home / Self Care | Payer: Self-pay | Source: Ambulatory Visit | Attending: Medical Genetics | Admitting: Medical Genetics

## 2023-06-29 DIAGNOSIS — Z006 Encounter for examination for normal comparison and control in clinical research program: Secondary | ICD-10-CM | POA: Insufficient documentation

## 2023-07-11 ENCOUNTER — Ambulatory Visit: Payer: 59 | Admitting: Primary Care

## 2023-07-11 VITALS — BP 124/72 | HR 76 | Temp 97.5°F | Ht 71.0 in | Wt >= 6400 oz

## 2023-07-11 DIAGNOSIS — Z125 Encounter for screening for malignant neoplasm of prostate: Secondary | ICD-10-CM

## 2023-07-11 DIAGNOSIS — Z23 Encounter for immunization: Secondary | ICD-10-CM | POA: Diagnosis not present

## 2023-07-11 DIAGNOSIS — I10 Essential (primary) hypertension: Secondary | ICD-10-CM

## 2023-07-11 DIAGNOSIS — G4733 Obstructive sleep apnea (adult) (pediatric): Secondary | ICD-10-CM | POA: Diagnosis not present

## 2023-07-11 DIAGNOSIS — M069 Rheumatoid arthritis, unspecified: Secondary | ICD-10-CM | POA: Insufficient documentation

## 2023-07-11 DIAGNOSIS — M0579 Rheumatoid arthritis with rheumatoid factor of multiple sites without organ or systems involvement: Secondary | ICD-10-CM

## 2023-07-11 DIAGNOSIS — R7303 Prediabetes: Secondary | ICD-10-CM

## 2023-07-11 DIAGNOSIS — E785 Hyperlipidemia, unspecified: Secondary | ICD-10-CM

## 2023-07-11 DIAGNOSIS — Z7252 High risk homosexual behavior: Secondary | ICD-10-CM | POA: Diagnosis not present

## 2023-07-11 DIAGNOSIS — M1711 Unilateral primary osteoarthritis, right knee: Secondary | ICD-10-CM

## 2023-07-11 DIAGNOSIS — Z0000 Encounter for general adult medical examination without abnormal findings: Secondary | ICD-10-CM

## 2023-07-11 LAB — LIPID PANEL
Cholesterol: 178 mg/dL (ref 0–200)
HDL: 38.8 mg/dL — ABNORMAL LOW (ref >39.00)
LDL Cholesterol: 115 mg/dL — ABNORMAL HIGH (ref 0–99)
NonHDL: 139.55
Total CHOL/HDL Ratio: 5
Triglycerides: 123 mg/dL (ref 0.0–149.0)
VLDL: 24.6 mg/dL (ref 0.0–40.0)

## 2023-07-11 LAB — HEMOGLOBIN A1C: Hgb A1c MFr Bld: 6.2 % (ref 4.6–6.5)

## 2023-07-11 LAB — PSA: PSA: 0.69 ng/mL (ref 0.10–4.00)

## 2023-07-11 NOTE — Assessment & Plan Note (Signed)
Repeat A1C pending.  Discussed the importance of a healthy diet and regular exercise in order for weight loss, and to reduce the risk of further co-morbidity.  

## 2023-07-11 NOTE — Progress Notes (Signed)
 Subjective:    Patient ID: Timothy Delacruz, male    DOB: 1971-02-03, 53 y.o.   MRN: [id number]  HPI  Timothy Delacruz is a very pleasant 53 y.o. male who presents today for complete physical and follow up of chronic conditions.  Immunizations: -Tetanus: Completed in 2020 -Influenza: Completed today -Shingles: Completed Shingrix series   Diet: Fair diet.  Exercise: No regular exercise.  Eye exam: Completes annually  Dental exam: Completes semi-annually    Colonoscopy: Completed in 2021, due 2028  PSA: Due   BP Readings from Last 3 Encounters:  07/11/23 124/72  04/05/23 132/78  11/23/22 136/82        Review of Systems  Constitutional:  Negative for unexpected weight change.  HENT:  Negative for rhinorrhea.   Respiratory:  Negative for cough and shortness of breath.   Cardiovascular:  Negative for chest pain.  Gastrointestinal:  Negative for constipation and diarrhea.  Genitourinary:  Negative for difficulty urinating.  Musculoskeletal:  Positive for arthralgias.  Skin:  Negative for rash.  Allergic/Immunologic: Negative for environmental allergies.  Neurological:  Negative for dizziness and headaches.  Psychiatric/Behavioral:  The patient is not nervous/anxious.          Past Medical History:  Diagnosis Date   Acute knee pain 10/22/2019   Epidermal cyst 01/14/2013   Hyperlipidemia    Hypertension    Loss or death of partner 2013/03/28   Low testosterone  03/22/2013    Social History   Socioeconomic History   Marital status: Single    Spouse name: Not on file   Number of children: Not on file   Years of education: Not on file   Highest education level: Associate degree: academic program  Occupational History   Not on file  Tobacco Use   Smoking status: Never   Smokeless tobacco: Never  Vaping Use   Vaping status: Never Used  Substance and Sexual Activity   Alcohol use: Yes    Comment: occ   Drug use: No   Sexual activity: Not Currently     Partners: Male    Comment: no condoms  Other Topics Concern   Not on file  Social History Narrative   Not on file   Social Drivers of Health   Financial Resource Strain: Low Risk  (07/11/2023)   Overall Financial Resource Strain (CARDIA)    Difficulty of Paying Living Expenses: Not hard at all  Food Insecurity: No Food Insecurity (07/11/2023)   Hunger Vital Sign    Worried About Running Out of Food in the Last Year: Never true    Ran Out of Food in the Last Year: Never true  Transportation Needs: No Transportation Needs (07/11/2023)   PRAPARE - Administrator, Civil Service (Medical): No    Lack of Transportation (Non-Medical): No  Physical Activity: Inactive (07/11/2023)   Exercise Vital Sign    Days of Exercise per Week: 0 days    Minutes of Exercise per Session: 40 min  Stress: No Stress Concern Present (07/11/2023)   [employer] of Occupational Health - Occupational Stress Questionnaire    Feeling of Stress : Not at all  Social Connections: Socially Isolated (07/11/2023)   Social Connection and Isolation Panel [NHANES]    Frequency of Communication with Friends and Family: More than three times a week    Frequency of Social Gatherings with Friends and Family: Once a week    Attends Religious Services: Never    Database Administrator or  Organizations: No    Attends Banker Meetings: Not on file    Marital Status: Never married  Intimate Partner Violence: Not on file    Past Surgical History:  Procedure Laterality Date   COLONOSCOPY WITH PROPOFOL  N/A 07/19/2019   Procedure: COLONOSCOPY WITH PROPOFOL ;  Surgeon: Therisa Bi, MD;  Location: [facility] ENDOSCOPY;  Service: Gastroenterology;  Laterality: N/A;   LAPAROSCOPIC GASTRIC BANDING  02/2011    Family History  Problem Relation Age of Onset   Cancer Father        prostate   Heart attack Paternal Grandmother    Cancer Paternal Grandfather        Cancer   Cancer Paternal Aunt        lung     Allergies  Allergen Reactions   Lisinopril     REACTION: cough    Current Outpatient Medications on File Prior to Visit  Medication Sig Dispense Refill   calcium citrate-vitamin D  200-200 MG-UNIT TABS Take 4 tablets by mouth daily.       diclofenac Sodium (VOLTAREN) 1 % GEL Apply 2 g topically 4 (four) times daily.     emtricitabine-tenofovir (TRUVADA) 200-300 MG tablet Take 1 tablet by mouth daily.     folic acid (FOLVITE) 1 MG tablet Take 1 mg by mouth daily.     hydrochlorothiazide  (HYDRODIURIL ) 25 MG tablet TAKE 1 TABLET BY MOUTH DAILY FOR BLOOD PRESSURE 90 tablet 0   magnesium 30 MG tablet Take 30 mg by mouth 2 (two) times daily.     methotrexate (RHEUMATREX) 2.5 MG tablet Take 15 mg by mouth once a week.     Multiple Vitamins-Minerals (MULTIVITAMIN WITH MINERALS) tablet Take 1 tablet by mouth daily.       POTASSIUM PO Take by mouth.     No current facility-administered medications on file prior to visit.    BP 124/72   Pulse 76   Temp (!) 97.5 F (36.4 C) (Temporal)   Ht 5' 11 (1.803 m)   Wt (!) 450 lb (204.1 kg)   SpO2 98%   BMI 62.76 kg/m  Objective:   Physical Exam HENT:     Right Ear: Tympanic membrane and ear canal normal.     Left Ear: Tympanic membrane and ear canal normal.  Eyes:     Pupils: Pupils are equal, round, and reactive to light.  Cardiovascular:     Rate and Rhythm: Normal rate and regular rhythm.  Pulmonary:     Effort: Pulmonary effort is normal.     Breath sounds: Normal breath sounds.  Abdominal:     General: Bowel sounds are normal.     Palpations: Abdomen is soft.     Tenderness: There is no abdominal tenderness.  Musculoskeletal:        General: Normal range of motion.     Cervical back: Neck supple.  Skin:    General: Skin is warm and dry.  Neurological:     Mental Status: He is alert and oriented to person, place, and time.     Cranial Nerves: No cranial nerve deficit.     Deep Tendon Reflexes:     Reflex Scores:       Patellar reflexes are 2+ on the right side and 2+ on the left side. Psychiatric:        Mood and Affect: Mood normal.           Assessment & Plan:  Preventative health care Assessment & Plan: Immunizations UTD. Influenza  vaccine provided today.  Colonoscopy UTD, due 2028 PSA due and pending.  Discussed the importance of a healthy diet and regular exercise in order for weight loss, and to reduce the risk of further co-morbidity.  Exam stable. Labs pending.  Follow up in 1 year for repeat physical.    Essential hypertension, benign Assessment & Plan: Controlled.  Continue hydrochlorothiazide  25 mg daily. CMP reviewed from rheumatology through [facility] from November 2024.   OSA (obstructive sleep apnea) Assessment & Plan: Controlled.  Continue CPAP. Following with pulmonology.   High risk homosexual behavior Assessment & Plan: Following with infectious disease. Continue Truvada 200-300 mg daily.   Hyperlipidemia, unspecified hyperlipidemia type Assessment & Plan: Repeat lipid panel pending.  Discussed the importance of a healthy diet and regular exercise in order for weight loss, and to reduce the risk of further co-morbidity.   Orders: -     Lipid panel  Prediabetes Assessment & Plan: Repeat A1C pending.  Discussed the importance of a healthy diet and regular exercise in order for weight loss, and to reduce the risk of further co-morbidity.   Orders: -     Hemoglobin A1c  Rheumatoid arthritis involving multiple sites with positive rheumatoid factor [facility]) Assessment & Plan: Following with rheumatology, reviewed office notes from November 2024. Continue methotrexate 20 mg weekly. Continue folic acid 1 mg daily.  Handicap placard provided today.    Primary osteoarthritis of right knee Assessment & Plan: Following with orthopedics. Continue injections as needed.  Handicap placard provided today.   Screening for prostate cancer -      PSA        Comer MARLA Gaskins, NP

## 2023-07-11 NOTE — Assessment & Plan Note (Signed)
 Controlled.  Continue CPAP. Following with pulmonology.

## 2023-07-11 NOTE — Assessment & Plan Note (Signed)
 Following with infectious disease. Continue Truvada 200-300 mg daily.

## 2023-07-11 NOTE — Assessment & Plan Note (Addendum)
 Controlled.  Continue hydrochlorothiazide 25 mg daily. CMP reviewed from rheumatology through [facility] from November 2024.

## 2023-07-11 NOTE — Patient Instructions (Signed)
 Stop by the lab prior to leaving today. I will notify you of your results once received.   It was a pleasure to see you today!

## 2023-07-11 NOTE — Assessment & Plan Note (Signed)
Repeat lipid panel pending.  Discussed the importance of a healthy diet and regular exercise in order for weight loss, and to reduce the risk of further co-morbidity.  

## 2023-07-11 NOTE — Assessment & Plan Note (Signed)
 Following with orthopedics. Continue injections as needed.  Handicap placard provided today.

## 2023-07-11 NOTE — Assessment & Plan Note (Signed)
 Following with rheumatology, reviewed office notes from November 2024. Continue methotrexate 20 mg weekly. Continue folic acid 1 mg daily.  Handicap placard provided today.

## 2023-07-11 NOTE — Assessment & Plan Note (Signed)
 Immunizations UTD. Influenza vaccine provided today.  Colonoscopy UTD, due 2028 PSA due and pending.  Discussed the importance of a healthy diet and regular exercise in order for weight loss, and to reduce the risk of further co-morbidity.  Exam stable. Labs pending.  Follow up in 1 year for repeat physical.

## 2023-07-17 LAB — GENECONNECT MOLECULAR SCREEN: Genetic Analysis Overall Interpretation: NEGATIVE

## 2023-07-24 ENCOUNTER — Other Ambulatory Visit: Payer: Self-pay | Admitting: Primary Care

## 2023-07-24 DIAGNOSIS — I10 Essential (primary) hypertension: Secondary | ICD-10-CM

## 2023-08-28 ENCOUNTER — Encounter: Payer: Self-pay | Admitting: Adult Health

## 2023-08-28 ENCOUNTER — Ambulatory Visit: Payer: 59 | Admitting: Adult Health

## 2023-08-28 VITALS — BP 142/72 | HR 74 | Temp 97.6°F | Ht 71.0 in | Wt >= 6400 oz

## 2023-08-28 DIAGNOSIS — G4733 Obstructive sleep apnea (adult) (pediatric): Secondary | ICD-10-CM

## 2023-08-28 NOTE — Patient Instructions (Signed)
Continue on CPAP at bedtime and with naps Goal is to wear your CPAP all night long, for at least 6 hours or more Healthy sleep regimen Do not drive if sleepy Work on healthy weight loss Follow-up in 1 year and As needed

## 2023-08-28 NOTE — Assessment & Plan Note (Signed)
Healthy weight loss discussed in detail 

## 2023-08-28 NOTE — Assessment & Plan Note (Signed)
 Excellent control compliance on nocturnal CPAP.  Continue on current settings  Plan  Patient Instructions  Continue on CPAP at bedtime and with naps Goal is to wear your CPAP all night long, for at least 6 hours or more Healthy sleep regimen Do not drive if sleepy Work on healthy weight loss Follow-up in 1 year and As needed

## 2023-08-28 NOTE — Progress Notes (Signed)
 @Patient  ID: Timothy Delacruz, male    DOB: 12/31/1970, 53 y.o.   MRN: [id number]  Chief Complaint  Patient presents with   Follow-up    Patient is doing good     Referring provider: Doreene Nest, NP  HPI: 53 yo male followed for severe sleep apnea.  Medical history significant for RA on methotrexate.   TEST/EVENTS :  Split night  Nov 10, 2021 showed very severe sleep apnea with a AHI at 80.1/hour, AHI while supine 111/hour, (no CPAP titration was done)   08/28/2023 Follow up : OSA  Patient presents for a 1 year follow-up for sleep apnea.  Patient has very severe sleep apnea.  He is on CPAP at bedtime.  Patient says overall he is doing very well on CPAP.  Feels that he is benefiting from CPAP with decreased daytime sleepiness. CPAP download shows excellent compliance with daily usage at 6.5 hours.  Patient is on auto CPAP 5 to 15 cm H2O.  Uses the my air app.  AHI 1.2/hour.  Daily average pressure at 14.7 cm H2O.  Uses a fullface mask.  [employer] is his DME.  Started on Methotrexate last year with new diagnosed rheumatoid arthritis.  Since starting methotrexate said his arthritis/joint pain has been much better.  Works full-time at [employer]  Allergies  Allergen Reactions   Lisinopril     REACTION: cough    Immunization History  Administered Date(s) Administered   Influenza, Seasonal, Injecte, Preservative Fre 07/11/2023   Influenza,inj,Quad PF,6+ Mos 06/30/2021, 07/06/2022   Influenza-Unspecified 04/08/2019   PFIZER(Purple Top)SARS-COV-2 Vaccination 09/13/2019, 10/08/2019   Td 06/09/2009   Tdap 06/13/2019   Zoster Recombinant(Shingrix) 04/08/2019, 06/13/2019    Past Medical History:  Diagnosis Date   Acute knee pain 10/22/2019   Epidermal cyst 01/14/2013   Hyperlipidemia    Hypertension    Loss or death of partner 03-25-13   Low testosterone 03/22/2013    Tobacco History: Social History   Tobacco Use  Smoking Status Never   Smokeless Tobacco Never   Counseling given: Not Answered   Outpatient Medications Prior to Visit  Medication Sig Dispense Refill   calcium citrate-vitamin D 200-200 MG-UNIT TABS Take 4 tablets by mouth daily.       diclofenac Sodium (VOLTAREN) 1 % GEL Apply 2 g topically 4 (four) times daily.     emtricitabine-tenofovir (TRUVADA) 200-300 MG tablet Take 1 tablet by mouth daily.     folic acid (FOLVITE) 1 MG tablet Take 1 mg by mouth daily.     hydrochlorothiazide (HYDRODIURIL) 25 MG tablet TAKE 1 TABLET BY MOUTH DAILY FOR BLOOD PRESSURE 90 tablet 3   magnesium 30 MG tablet Take 30 mg by mouth 2 (two) times daily.     methotrexate (RHEUMATREX) 2.5 MG tablet Take 15 mg by mouth once a week.     Multiple Vitamins-Minerals (MULTIVITAMIN WITH MINERALS) tablet Take 1 tablet by mouth daily.       POTASSIUM PO Take by mouth.     No facility-administered medications prior to visit.     Review of Systems:   Constitutional:   No  weight loss, night sweats,  Fevers, chills, fatigue, or  lassitude.  HEENT:   No headaches,  Difficulty swallowing,  Tooth/dental problems, or  Sore throat,                No sneezing, itching, ear ache, nasal congestion, post nasal drip,   CV:  No chest pain,  Orthopnea,  PND, swelling in lower extremities, anasarca, dizziness, palpitations, syncope.   GI  No heartburn, indigestion, abdominal pain, nausea, vomiting, diarrhea, change in bowel habits, loss of appetite, bloody stools.   Resp: No shortness of breath with exertion or at rest.  No excess mucus, no productive cough,  No non-productive cough,  No coughing up of blood.  No change in color of mucus.  No wheezing.  No chest wall deformity  Skin: no rash or lesions.  GU: no dysuria, change in color of urine, no urgency or frequency.  No flank pain, no hematuria   MS:  +joint pain  No decreased range of motion.  No back pain.    Physical Exam  BP (!) 142/72 (BP Location: Left Arm, Patient Position:  Sitting, Cuff Size: Large)   Pulse 74   Temp 97.6 F (36.4 C) (Oral)   Ht 5\' 11"  (1.803 m)   Wt (!) 461 lb (209.1 kg)   SpO2 97%   BMI 64.30 kg/m   GEN: A/Ox3; pleasant , NAD, well nourished    HEENT:  [place]/AT,  NOSE-clear, THROAT-clear, no lesions, no postnasal drip or exudate noted.  Class 2-3 MP airway   NECK:  Supple w/ fair ROM; no JVD; normal carotid impulses w/o bruits; no thyromegaly or nodules palpated; no lymphadenopathy.    RESP  Clear  P & A; w/o, wheezes/ rales/ or rhonchi. no accessory muscle use, no dullness to percussion  CARD:  RRR, no m/r/g, 1-2 +peripheral edema, pulses intact, no cyanosis or clubbing.  Stasis dermatitis changes  GI:   Soft & nt; nml bowel sounds; no organomegaly or masses detected.   Musco: Warm bil, no deformities or joint swelling noted.   Neuro: alert, no focal deficits noted.    Skin: Warm, no lesions or rashes    Lab Results:  CBC    Component Value Date/Time   WBC 8.3 07/02/2021 1306   RBC 4.91 07/02/2021 1306   HGB 14.3 07/02/2021 1306   HCT 43.2 07/02/2021 1306   HCT 45.6 05/13/2020 1425   PLT 261.0 07/02/2021 1306   MCV 88.1 07/02/2021 1306   MCH 28.4 03/02/2011 0455   MCHC 33.1 07/02/2021 1306   RDW 14.0 07/02/2021 1306   LYMPHSABS 1.9 03/02/2011 0455   MONOABS 0.9 03/02/2011 0455   EOSABS 0.1 03/02/2011 0455   BASOSABS 0.0 03/02/2011 0455    BMET    Component Value Date/Time   NA 138 07/06/2022 1059   K 3.9 07/06/2022 1059   CL 100 07/06/2022 1059   CO2 27 07/06/2022 1059   GLUCOSE 96 07/06/2022 1059   BUN 11 07/06/2022 1059   CREATININE 0.71 07/06/2022 1059   CREATININE 0.84 12/15/2010 0941   CALCIUM 9.3 07/06/2022 1059   GFRNONAA >60 02/22/2011 1030   GFRAA >60 02/22/2011 1030    BNP No results found for: "BNP"  ProBNP No results found for: "PROBNP"  Imaging: No results found.  Administration History     None           No data to display          No results found for:  "NITRICOXIDE"      Assessment & Plan:   OSA (obstructive sleep apnea) Excellent control compliance on nocturnal CPAP.  Continue on current settings  Plan  Patient Instructions  Continue on CPAP at bedtime and with naps Goal is to wear your CPAP all night long, for at least 6 hours or more Healthy sleep regimen Do not  drive if sleepy Work on healthy weight loss Follow-up in 1 year and As needed       OBESITY, MORBID Healthy weight loss discussed in detail     Rubye Oaks, NP 08/28/2023

## 2023-12-08 ENCOUNTER — Ambulatory Visit: Admitting: Primary Care

## 2023-12-26 ENCOUNTER — Ambulatory Visit: Admitting: Primary Care

## 2023-12-26 VITALS — BP 132/74 | HR 85 | Temp 97.7°F | Ht 71.0 in | Wt >= 6400 oz

## 2023-12-26 DIAGNOSIS — M79602 Pain in left arm: Secondary | ICD-10-CM

## 2023-12-26 DIAGNOSIS — T148XXA Other injury of unspecified body region, initial encounter: Secondary | ICD-10-CM | POA: Diagnosis not present

## 2023-12-26 MED ORDER — PREDNISONE 20 MG PO TABS: ORAL_TABLET | ORAL | 0 refills | Status: DC

## 2023-12-26 NOTE — Patient Instructions (Signed)
 Start prednisone  tablets. Take two tablets my mouth once daily in the morning for four days, then one tablet once daily in the morning for four days.   You will either be contacted via phone regarding your referral to wound clinic, or you may receive a letter on your MyChart portal from our referral team with instructions for scheduling an appointment. Please let us  know if you have not been contacted by anyone within two weeks.  It was a pleasure to see you today!

## 2023-12-26 NOTE — Assessment & Plan Note (Signed)
 Pinched nerve suspected given symptoms.  Start prednisone  tablets. Take two tablets my mouth once daily in the morning for four days, then one tablet once daily in the morning for four days.  He will also contact his orthopedist for a potential injection.

## 2023-12-26 NOTE — Assessment & Plan Note (Signed)
 Without infection.  Given chronicity, will refer to wound clinic. Keep dermatology appointment if needed.  He understands when to report for potential signs of infection.

## 2023-12-26 NOTE — Progress Notes (Signed)
 Subjective:    Patient ID: Timothy Delacruz, male    DOB: [birth year], 53 y.o.   MRN: [id number]  HPI  Timothy Delacruz is a very pleasant 53 y.o. male with a history of morbid obesity, peripheral vascular disease, rheumatoid arthritis on immunosuppressant treatment, hypertension, prediabetes who presents today to discuss skin wound and left upper extremity pain.  1) Skin Wound: His wound is located to the right groin for which he first noticed in early 2025. The wound will intermittently drain. In late May 2025 he noticed the wound began draining again. He scheduled an appointment with dermatology, had an appointment for today but was rescheduled to late August.   The wound is draining clear liquid, sometimes bloody, sometimes a whitish red color. Mostly clear color recently. He denies discomfort, fevers.   2) Left Upper Extremity Pain: Chronic for the last 3 months. His pain is located to the left anterior forearm (anatomical position) for which is intermittent and only noticeable with flexion. He will also notice paresthesias. He denies color changes, repetitive movement of the left upper extremity. His symptoms resolve if his arm is extended. He does follow with orthopedics.    Review of Systems  Constitutional:  Negative for fever.  Musculoskeletal:  Positive for arthralgias.  Skin:  Negative for color change.       Skin wound  Neurological:  Positive for numbness.         Past Medical History:  Diagnosis Date   Acute knee pain 10/22/2019   Epidermal cyst 01/14/2013   Hyperlipidemia    Hypertension    Loss or death of partner 2013-03-26   Low testosterone  03/22/2013    Social History   Socioeconomic History   Marital status: Single    Spouse name: Not on file   Number of children: Not on file   Years of education: Not on file   Highest education level: Associate degree: academic program  Occupational History   Not on file  Tobacco Use   Smoking status: Never    Smokeless tobacco: Never  Vaping Use   Vaping status: Never Used  Substance and Sexual Activity   Alcohol use: Yes    Comment: occ   Drug use: No   Sexual activity: Not Currently    Partners: Male    Comment: no condoms  Other Topics Concern   Not on file  Social History Narrative   Not on file   Social Drivers of Health   Financial Resource Strain: Low Risk  (07/11/2023)   Overall Financial Resource Strain (CARDIA)    Difficulty of Paying Living Expenses: Not hard at all  Food Insecurity: No Food Insecurity (07/11/2023)   Hunger Vital Sign    Worried About Running Out of Food in the Last Year: Never true    Ran Out of Food in the Last Year: Never true  Transportation Needs: No Transportation Needs (07/11/2023)   PRAPARE - Administrator, Civil Service (Medical): No    Lack of Transportation (Non-Medical): No  Physical Activity: Unknown (07/11/2023)   Exercise Vital Sign    Days of Exercise per Week: 0 days    Minutes of Exercise per Session: Not on file  Recent Concern: Physical Activity - Inactive (07/11/2023)   Exercise Vital Sign    Days of Exercise per Week: 0 days    Minutes of Exercise per Session: 40 min  Stress: No Stress Concern Present (07/11/2023)   [employer] of Occupational Health -  Occupational Stress Questionnaire    Feeling of Stress : Not at all  Social Connections: Socially Isolated (07/11/2023)   Social Connection and Isolation Panel    Frequency of Communication with Friends and Family: More than three times a week    Frequency of Social Gatherings with Friends and Family: Once a week    Attends Religious Services: Never    Database administrator or Organizations: No    Attends Engineer, structural: Not on file    Marital Status: Never married  Intimate Partner Violence: Not on file    Past Surgical History:  Procedure Laterality Date   COLONOSCOPY WITH PROPOFOL  N/A 07/19/2019   Procedure: COLONOSCOPY WITH PROPOFOL ;   Surgeon: Therisa Bi, MD;  Location: [facility] ENDOSCOPY;  Service: Gastroenterology;  Laterality: N/A;   LAPAROSCOPIC GASTRIC BANDING  02/2011    Family History  Problem Relation Age of Onset   Cancer Father        prostate   Heart attack Paternal Grandmother    Cancer Paternal Grandfather        Cancer   Cancer Paternal Aunt        lung    Allergies  Allergen Reactions   Lisinopril     REACTION: cough    Current Outpatient Medications on File Prior to Visit  Medication Sig Dispense Refill   calcium citrate-vitamin D  200-200 MG-UNIT TABS Take 4 tablets by mouth daily.       diclofenac Sodium (VOLTAREN) 1 % GEL Apply 2 g topically 4 (four) times daily.     emtricitabine-tenofovir (TRUVADA) 200-300 MG tablet Take 1 tablet by mouth daily.     folic acid (FOLVITE) 1 MG tablet Take 1 mg by mouth daily.     hydrochlorothiazide  (HYDRODIURIL ) 25 MG tablet TAKE 1 TABLET BY MOUTH DAILY FOR BLOOD PRESSURE 90 tablet 3   magnesium 30 MG tablet Take 30 mg by mouth 2 (two) times daily.     methotrexate (RHEUMATREX) 2.5 MG tablet Take 15 mg by mouth once a week.     Multiple Vitamins-Minerals (MULTIVITAMIN WITH MINERALS) tablet Take 1 tablet by mouth daily.       POTASSIUM PO Take by mouth.     hydroxychloroquine (PLAQUENIL) 200 MG tablet Take 200 mg by mouth 2 (two) times daily.     No current facility-administered medications on file prior to visit.    BP 132/74   Pulse 85   Temp 97.7 F (36.5 C) (Oral)   Ht 5' 11 (1.803 m)   Wt (!) 455 lb (206.4 kg)   SpO2 98%   BMI 63.46 kg/m  Objective:   Physical Exam Constitutional:      General: He is not in acute distress.  Cardiovascular:     Rate and Rhythm: Normal rate.  Pulmonary:     Effort: Pulmonary effort is normal.   Skin:    General: Skin is warm and dry.     Findings: No erythema.     Comments: 1 mm rounded, flat opening to right groin with mild serosanguineous drainage. Chaperone present.    Neurological:     Mental  Status: He is alert.           Assessment & Plan:  Pain of left upper extremity Assessment & Plan: Pinched nerve suspected given symptoms.  Start prednisone  tablets. Take two tablets my mouth once daily in the morning for four days, then one tablet once daily in the morning for four days.  He will  also contact his orthopedist for a potential injection.   Orders: -     predniSONE ; Take two tablets my mouth once daily in the morning for four days, then one tablet once daily in the morning for four days.  Dispense: 12 tablet; Refill: 0  Open wound Assessment & Plan: Without infection.  Given chronicity, will refer to wound clinic. Keep dermatology appointment if needed.  He understands when to report for potential signs of infection.  Orders: -     Ambulatory referral to Wound Clinic        Comer MARLA Gaskins, NP

## 2024-01-11 ENCOUNTER — Encounter: Attending: Physician Assistant | Admitting: Physician Assistant

## 2024-01-11 DIAGNOSIS — I10 Essential (primary) hypertension: Secondary | ICD-10-CM | POA: Diagnosis not present

## 2024-01-11 DIAGNOSIS — L02214 Cutaneous abscess of groin: Secondary | ICD-10-CM | POA: Insufficient documentation

## 2024-01-11 DIAGNOSIS — L98492 Non-pressure chronic ulcer of skin of other sites with fat layer exposed: Secondary | ICD-10-CM | POA: Diagnosis not present

## 2024-01-11 DIAGNOSIS — R7303 Prediabetes: Secondary | ICD-10-CM | POA: Insufficient documentation

## 2024-01-18 ENCOUNTER — Encounter: Admitting: Physician Assistant

## 2024-01-18 DIAGNOSIS — L02214 Cutaneous abscess of groin: Secondary | ICD-10-CM | POA: Diagnosis not present

## 2024-01-22 ENCOUNTER — Ambulatory Visit: Admitting: Physician Assistant

## 2024-01-25 ENCOUNTER — Encounter: Admitting: Physician Assistant

## 2024-01-25 DIAGNOSIS — L02214 Cutaneous abscess of groin: Secondary | ICD-10-CM | POA: Diagnosis not present

## 2024-02-02 ENCOUNTER — Encounter: Attending: Physician Assistant | Admitting: Physician Assistant

## 2024-02-02 DIAGNOSIS — I10 Essential (primary) hypertension: Secondary | ICD-10-CM | POA: Diagnosis not present

## 2024-02-02 DIAGNOSIS — R7303 Prediabetes: Secondary | ICD-10-CM | POA: Diagnosis not present

## 2024-02-02 DIAGNOSIS — L98492 Non-pressure chronic ulcer of skin of other sites with fat layer exposed: Secondary | ICD-10-CM | POA: Diagnosis not present

## 2024-02-02 DIAGNOSIS — L02214 Cutaneous abscess of groin: Secondary | ICD-10-CM | POA: Insufficient documentation

## 2024-02-09 ENCOUNTER — Encounter: Admitting: Physician Assistant

## 2024-02-09 DIAGNOSIS — L02214 Cutaneous abscess of groin: Secondary | ICD-10-CM | POA: Diagnosis not present

## 2024-02-15 ENCOUNTER — Encounter: Admitting: Physician Assistant

## 2024-02-15 DIAGNOSIS — L02214 Cutaneous abscess of groin: Secondary | ICD-10-CM | POA: Diagnosis not present

## 2024-02-21 ENCOUNTER — Encounter: Admitting: Internal Medicine

## 2024-02-21 DIAGNOSIS — L02214 Cutaneous abscess of groin: Secondary | ICD-10-CM | POA: Diagnosis not present

## 2024-02-29 ENCOUNTER — Ambulatory Visit: Admitting: Physician Assistant

## 2024-03-01 ENCOUNTER — Encounter: Attending: Physician Assistant | Admitting: Physician Assistant

## 2024-03-01 DIAGNOSIS — E669 Obesity, unspecified: Secondary | ICD-10-CM | POA: Insufficient documentation

## 2024-03-01 DIAGNOSIS — Z6841 Body Mass Index (BMI) 40.0 and over, adult: Secondary | ICD-10-CM | POA: Insufficient documentation

## 2024-03-01 DIAGNOSIS — L98492 Non-pressure chronic ulcer of skin of other sites with fat layer exposed: Secondary | ICD-10-CM | POA: Insufficient documentation

## 2024-03-01 DIAGNOSIS — R7303 Prediabetes: Secondary | ICD-10-CM | POA: Insufficient documentation

## 2024-03-01 DIAGNOSIS — I10 Essential (primary) hypertension: Secondary | ICD-10-CM | POA: Insufficient documentation

## 2024-03-08 ENCOUNTER — Encounter: Admitting: Physician Assistant

## 2024-03-08 DIAGNOSIS — L98492 Non-pressure chronic ulcer of skin of other sites with fat layer exposed: Secondary | ICD-10-CM | POA: Diagnosis present

## 2024-03-08 DIAGNOSIS — R7303 Prediabetes: Secondary | ICD-10-CM | POA: Diagnosis not present

## 2024-03-08 DIAGNOSIS — I10 Essential (primary) hypertension: Secondary | ICD-10-CM | POA: Diagnosis not present

## 2024-03-08 DIAGNOSIS — E669 Obesity, unspecified: Secondary | ICD-10-CM | POA: Diagnosis not present

## 2024-03-08 DIAGNOSIS — Z6841 Body Mass Index (BMI) 40.0 and over, adult: Secondary | ICD-10-CM | POA: Diagnosis not present

## 2024-03-15 ENCOUNTER — Encounter: Admitting: Physician Assistant

## 2024-03-15 DIAGNOSIS — L98492 Non-pressure chronic ulcer of skin of other sites with fat layer exposed: Secondary | ICD-10-CM | POA: Diagnosis not present

## 2024-03-22 ENCOUNTER — Encounter: Admitting: Physician Assistant

## 2024-03-22 DIAGNOSIS — L98492 Non-pressure chronic ulcer of skin of other sites with fat layer exposed: Secondary | ICD-10-CM | POA: Diagnosis not present

## 2024-03-29 ENCOUNTER — Encounter: Attending: Physician Assistant | Admitting: Physician Assistant

## 2024-03-29 DIAGNOSIS — R7303 Prediabetes: Secondary | ICD-10-CM | POA: Insufficient documentation

## 2024-03-29 DIAGNOSIS — I10 Essential (primary) hypertension: Secondary | ICD-10-CM | POA: Insufficient documentation

## 2024-03-29 DIAGNOSIS — E669 Obesity, unspecified: Secondary | ICD-10-CM | POA: Insufficient documentation

## 2024-03-29 DIAGNOSIS — Z6841 Body Mass Index (BMI) 40.0 and over, adult: Secondary | ICD-10-CM | POA: Insufficient documentation

## 2024-03-29 DIAGNOSIS — L98492 Non-pressure chronic ulcer of skin of other sites with fat layer exposed: Secondary | ICD-10-CM | POA: Insufficient documentation

## 2024-04-05 ENCOUNTER — Encounter: Admitting: Physician Assistant

## 2024-04-05 DIAGNOSIS — L98492 Non-pressure chronic ulcer of skin of other sites with fat layer exposed: Secondary | ICD-10-CM | POA: Diagnosis not present

## 2024-04-15 ENCOUNTER — Encounter: Admitting: Physician Assistant

## 2024-04-15 DIAGNOSIS — L98492 Non-pressure chronic ulcer of skin of other sites with fat layer exposed: Secondary | ICD-10-CM | POA: Diagnosis not present

## 2024-04-29 ENCOUNTER — Encounter: Attending: Physician Assistant | Admitting: Physician Assistant

## 2024-05-06 ENCOUNTER — Encounter: Admitting: Physician Assistant

## 2024-05-20 ENCOUNTER — Encounter: Admitting: Physician Assistant

## 2024-06-02 ENCOUNTER — Other Ambulatory Visit: Payer: Self-pay | Admitting: Primary Care

## 2024-06-02 DIAGNOSIS — I10 Essential (primary) hypertension: Secondary | ICD-10-CM

## 2024-06-03 NOTE — Telephone Encounter (Signed)
 Patient is due for CPE/follow up in late January 2026, this will be required prior to any further refills.  Please schedule, thank you!

## 2024-07-11 ENCOUNTER — Encounter: Admitting: Primary Care

## 2024-07-16 ENCOUNTER — Ambulatory Visit: Admitting: Primary Care

## 2024-07-16 ENCOUNTER — Encounter: Payer: Self-pay | Admitting: Primary Care

## 2024-07-16 ENCOUNTER — Ambulatory Visit: Payer: Self-pay | Admitting: Primary Care

## 2024-07-16 VITALS — BP 132/70 | HR 67 | Temp 97.8°F | Ht 68.0 in | Wt >= 6400 oz

## 2024-07-16 DIAGNOSIS — I10 Essential (primary) hypertension: Secondary | ICD-10-CM

## 2024-07-16 DIAGNOSIS — G4733 Obstructive sleep apnea (adult) (pediatric): Secondary | ICD-10-CM

## 2024-07-16 DIAGNOSIS — R1013 Epigastric pain: Secondary | ICD-10-CM | POA: Diagnosis not present

## 2024-07-16 DIAGNOSIS — Z125 Encounter for screening for malignant neoplasm of prostate: Secondary | ICD-10-CM | POA: Diagnosis not present

## 2024-07-16 DIAGNOSIS — E785 Hyperlipidemia, unspecified: Secondary | ICD-10-CM

## 2024-07-16 DIAGNOSIS — Z0001 Encounter for general adult medical examination with abnormal findings: Secondary | ICD-10-CM | POA: Diagnosis not present

## 2024-07-16 DIAGNOSIS — J029 Acute pharyngitis, unspecified: Secondary | ICD-10-CM | POA: Insufficient documentation

## 2024-07-16 DIAGNOSIS — J020 Streptococcal pharyngitis: Secondary | ICD-10-CM | POA: Diagnosis not present

## 2024-07-16 DIAGNOSIS — R7989 Other specified abnormal findings of blood chemistry: Secondary | ICD-10-CM

## 2024-07-16 DIAGNOSIS — R7303 Prediabetes: Secondary | ICD-10-CM

## 2024-07-16 DIAGNOSIS — M0579 Rheumatoid arthritis with rheumatoid factor of multiple sites without organ or systems involvement: Secondary | ICD-10-CM

## 2024-07-16 DIAGNOSIS — Z7252 High risk homosexual behavior: Secondary | ICD-10-CM

## 2024-07-16 LAB — LIPID PANEL
Cholesterol: 176 mg/dL (ref 28–200)
HDL: 42.6 mg/dL
LDL Cholesterol: 101 mg/dL — ABNORMAL HIGH (ref 10–99)
NonHDL: 132.94
Total CHOL/HDL Ratio: 4
Triglycerides: 159 mg/dL — ABNORMAL HIGH (ref 10.0–149.0)
VLDL: 31.8 mg/dL (ref 0.0–40.0)

## 2024-07-16 LAB — HEMOGLOBIN A1C: Hgb A1c MFr Bld: 5.9 % (ref 4.6–6.5)

## 2024-07-16 LAB — PSA: PSA: 0.69 ng/mL (ref 0.10–4.00)

## 2024-07-16 LAB — POCT RAPID STREP A (OFFICE): Rapid Strep A Screen: POSITIVE — AB

## 2024-07-16 LAB — TSH: TSH: 5.89 u[IU]/mL — ABNORMAL HIGH (ref 0.35–5.50)

## 2024-07-16 MED ORDER — AMOXICILLIN 500 MG PO CAPS: 500.0000 mg | ORAL_CAPSULE | Freq: Two times a day (BID) | ORAL | 0 refills | Status: AC

## 2024-07-16 NOTE — Progress Notes (Signed)
 "  Subjective:    Patient ID: Timothy Delacruz, male    DOB: [birth year], 54 y.o.   MRN: [id number]  Timothy Delacruz is a very pleasant 54 y.o. male who presents today for complete physical and follow up of chronic conditions.  He would also like to discuss scratchy throat. Acute onset about 2 evenings ago, feels like sandpaper in the back of his throat. He denies pain, cough. He feels like he has the beginning of something. Last weekend he was around about 40 people for his uncles birthday.  He's also noticed a burning sensation to the epigastric region intermittently for the last few months. He does notice these symptoms with particular food. He's taken Pepto Bismol a few times which helps to reduce symptoms. History of Lab band surgery years ago.  Immunizations: -Tetanus: Completed in 2020 -Influenza: Completed this season  -Shingles: Completed Shingrix series   Diet: Fair diet.  Exercise: No regular exercise.  Eye exam: Completes annually  Dental exam: Completes semi-annually    Colonoscopy: Completed in 2021, due 2028  PSA: Due    BP Readings from Last 3 Encounters:  07/16/24 132/70  12/26/23 132/74  08/28/23 (!) 142/72   Wt Readings from Last 3 Encounters:  07/16/24 (!) 454 lb 2 oz (206 kg)  12/26/23 (!) 455 lb (206.4 kg)  08/28/23 (!) 461 lb (209.1 kg)       Review of Systems  Constitutional:  Negative for unexpected weight change.  HENT:  Positive for sore throat. Negative for rhinorrhea.   Respiratory:  Negative for cough and shortness of breath.   Cardiovascular:  Negative for chest pain.  Gastrointestinal:  Negative for constipation and diarrhea.  Genitourinary:  Negative for difficulty urinating.  Musculoskeletal:  Negative for arthralgias and myalgias.  Skin:  Negative for rash.  Allergic/Immunologic: Negative for environmental allergies.  Neurological:  Negative for dizziness and headaches.  Psychiatric/Behavioral:  The patient is not  nervous/anxious.          Past Medical History:  Diagnosis Date   Acute knee pain 10/22/2019   Epidermal cyst 01/14/2013   Hyperlipidemia    Hypertension    Loss or death of partner 02-Apr-2013   Low testosterone  03/22/2013    Social History   Socioeconomic History   Marital status: Single    Spouse name: Not on file   Number of children: Not on file   Years of education: Not on file   Highest education level: Associate degree: academic program  Occupational History   Not on file  Tobacco Use   Smoking status: Never   Smokeless tobacco: Never  Vaping Use   Vaping status: Never Used  Substance and Sexual Activity   Alcohol use: Yes    Comment: occ   Drug use: No   Sexual activity: Not Currently    Partners: Male    Comment: no condoms  Other Topics Concern   Not on file  Social History Narrative   Not on file   Social Drivers of Health   Tobacco Use: Low Risk (07/16/2024)   Patient History    Smoking Tobacco Use: Never    Smokeless Tobacco Use: Never    Passive Exposure: Not on file  Financial Resource Strain: Low Risk (07/11/2023)   Overall Financial Resource Strain (CARDIA)    Difficulty of Paying Living Expenses: Not hard at all  Food Insecurity: No Food Insecurity (07/11/2023)   Hunger Vital Sign    Worried About Programme Researcher, Broadcasting/film/video in  the Last Year: Never true    Ran Out of Food in the Last Year: Never true  Transportation Needs: No Transportation Needs (07/11/2023)   PRAPARE - Administrator, Civil Service (Medical): No    Lack of Transportation (Non-Medical): No  Physical Activity: Unknown (07/11/2023)   Exercise Vital Sign    Days of Exercise per Week: 0 days    Minutes of Exercise per Session: Not on file  Recent Concern: Physical Activity - Inactive (07/11/2023)   Exercise Vital Sign    Days of Exercise per Week: 0 days    Minutes of Exercise per Session: 40 min  Stress: No Stress Concern Present (07/11/2023)   [employer] of  Occupational Health - Occupational Stress Questionnaire    Feeling of Stress : Not at all  Social Connections: Socially Isolated (07/11/2023)   Social Connection and Isolation Panel    Frequency of Communication with Friends and Family: More than three times a week    Frequency of Social Gatherings with Friends and Family: Once a week    Attends Religious Services: Never    Database Administrator or Organizations: No    Attends Engineer, Structural: Not on file    Marital Status: Never married  Intimate Partner Violence: Not on file  Depression (PHQ2-9): Low Risk (07/16/2024)   Depression (PHQ2-9)    PHQ-2 Score: 2  Alcohol Screen: Low Risk (07/11/2023)   Alcohol Screen    Last Alcohol Screening Score (AUDIT): 1  Housing: Unknown (07/04/2024)   Received from [facility]   Epic    Unable to Pay for Housing in the Last Year: Not on file    Number of Times Moved in the Last Year: Not on file    At any time in the past 12 months, were you homeless or living in a shelter (including now)?: No  Utilities: Not on file  Health Literacy: Not on file    Past Surgical History:  Procedure Laterality Date   COLONOSCOPY WITH PROPOFOL  N/A 07/19/2019   Procedure: COLONOSCOPY WITH PROPOFOL ;  Surgeon: Therisa Bi, MD;  Location: [facility] ENDOSCOPY;  Service: Gastroenterology;  Laterality: N/A;   LAPAROSCOPIC GASTRIC BANDING  02/2011    Family History  Problem Relation Age of Onset   Cancer Father        prostate   Heart attack Paternal Grandmother    Cancer Paternal Grandfather        Cancer   Cancer Paternal Aunt        lung    Allergies[1]  Medications Ordered Prior to Encounter[2]  BP 132/70   Pulse 67   Temp 97.8 F (36.6 C) (Oral)   Ht 5' 8 (1.727 m)   Wt (!) 454 lb 2 oz (206 kg)   SpO2 97%   BMI 69.05 kg/m  Objective:   Physical Exam HENT:     Right Ear: Tympanic membrane and ear canal normal.     Left Ear: Tympanic membrane and ear canal normal.      Mouth/Throat:     Mouth: Mucous membranes are moist.     Pharynx: No pharyngeal swelling, oropharyngeal exudate or posterior oropharyngeal erythema.  Eyes:     Pupils: Pupils are equal, round, and reactive to light.  Cardiovascular:     Rate and Rhythm: Normal rate and regular rhythm.  Pulmonary:     Effort: Pulmonary effort is normal.     Breath sounds: Normal breath sounds.  Abdominal:  General: Bowel sounds are normal.     Palpations: Abdomen is soft.     Tenderness: There is no abdominal tenderness.  Musculoskeletal:        General: Normal range of motion.     Cervical back: Neck supple.  Skin:    General: Skin is warm and dry.  Neurological:     Mental Status: He is alert and oriented to person, place, and time.     Cranial Nerves: No cranial nerve deficit.     Deep Tendon Reflexes:     Reflex Scores:      Patellar reflexes are 2+ on the right side and 2+ on the left side. Psychiatric:        Mood and Affect: Mood normal.     Physical Exam        Assessment & Plan:  Encounter for annual general medical examination with abnormal findings in adult Assessment & Plan: Immunizations UTD. Colonoscopy UTD, due 2028 PSA due and pending.  Discussed the importance of a healthy diet and regular exercise in order for weight loss, and to reduce the risk of further co-morbidity.  Exam stable. Labs pending.  Follow up in 1 year for repeat physical.    OSA (obstructive sleep apnea) Assessment & Plan: Continue CPAP nightly.    Essential hypertension, benign Assessment & Plan: Stable.  Continue hydrochlorothiazide  25 mg daily. CMP pending.  Orders: -     Lipid panel -     TSH  Rheumatoid arthritis involving multiple sites with positive rheumatoid factor [facility]) Assessment & Plan: Following with rheumatology, office notes reviewed from January 2026 through Care Everywhere.   Continue methotrexate 20 mg weekly, folic acid daily, Plaquenil 400 mg daily.     Prediabetes Assessment & Plan: Repeat A1C pending.  Work on a healthy diet and regular exercise in order for weight loss, and to reduce the risk of further co-morbidity.   Orders: -     Hemoglobin A1c  Screening for prostate cancer -     PSA  Sore throat Assessment & Plan: Rapid strep test positive today.  Start amoxicillin  500 mg antibiotics for the infection. Take 1 tablet by mouth twice daily for 10 days.   Orders: -     POCT rapid strep A  Hyperlipidemia, unspecified hyperlipidemia type Assessment & Plan: Repeat lipid panel pending.  Work on a healthy diet and regular exercise in order for weight loss, and to reduce the risk of further co-morbidity.    High risk homosexual behavior Assessment & Plan: Following with infectious disease. Continue Truvada 200-300 mg daily.   Strep pharyngitis -     Amoxicillin ; Take 1 capsule (500 mg total) by mouth 2 (two) times daily for 10 days.  Dispense: 20 capsule; Refill: 0  Epigastric pain Assessment & Plan: Likely secondary to reflux.  Will start famotidine 20 mg HS. Consider PPI if no improvement. Avoid trigger foods.     Assessment and Plan Assessment & Plan         Comer MARLA Gaskins, NP       [1]  Allergies Allergen Reactions   Lisinopril     REACTION: cough  [2]  Current Outpatient Medications on File Prior to Visit  Medication Sig Dispense Refill   calcium citrate-vitamin D  200-200 MG-UNIT TABS Take 4 tablets by mouth daily.       diclofenac Sodium (VOLTAREN) 1 % GEL Apply 2 g topically 4 (four) times daily.     emtricitabine-tenofovir (TRUVADA) 200-300 MG tablet  Take 1 tablet by mouth daily.     folic acid (FOLVITE) 1 MG tablet Take 1 mg by mouth daily.     hydrochlorothiazide  (HYDRODIURIL ) 25 MG tablet TAKE 1 TABLET BY MOUTH DAILY FOR BLOOD PRESSURE 90 tablet 0   hydroxychloroquine (PLAQUENIL) 200 MG tablet Take 200 mg by mouth 2 (two) times daily.     magnesium 30 MG tablet Take  30 mg by mouth 2 (two) times daily.     methotrexate (RHEUMATREX) 2.5 MG tablet Take 15 mg by mouth once a week.     Multiple Vitamins-Minerals (MULTIVITAMIN WITH MINERALS) tablet Take 1 tablet by mouth daily.       POTASSIUM PO Take by mouth.     No current facility-administered medications on file prior to visit.   "

## 2024-07-16 NOTE — Assessment & Plan Note (Signed)
 Repeat lipid panel pending.  Work on a healthy diet and regular exercise in order for weight loss, and to reduce the risk of further co-morbidity.

## 2024-07-16 NOTE — Assessment & Plan Note (Signed)
 Following with rheumatology, office notes reviewed from January 2026 through Care Everywhere.   Continue methotrexate 20 mg weekly, folic acid daily, Plaquenil 400 mg daily.

## 2024-07-16 NOTE — Assessment & Plan Note (Signed)
 Stable.  Continue hydrochlorothiazide 25 mg daily. CMP pending.

## 2024-07-16 NOTE — Assessment & Plan Note (Signed)
 Repeat A1C pending.  Work on a healthy diet and regular exercise in order for weight loss, and to reduce the risk of further co-morbidity.

## 2024-07-16 NOTE — Assessment & Plan Note (Signed)
 Rapid strep test positive today.  Start amoxicillin  500 mg antibiotics for the infection. Take 1 tablet by mouth twice daily for 10 days.

## 2024-07-16 NOTE — Patient Instructions (Addendum)
 Stop by the lab prior to leaving today. I will notify you of your results once received.   Start amoxicillin  antibiotics for the infection. Take 1 tablet by mouth twice daily for 10 days.  It was a pleasure to see you today!

## 2024-07-16 NOTE — Assessment & Plan Note (Signed)
Immunizations UTD. Colonoscopy UTD, due 2028 PSA due and pending.  Discussed the importance of a healthy diet and regular exercise in order for weight loss, and to reduce the risk of further co-morbidity.  Exam stable. Labs pending.  Follow up in 1 year for repeat physical.

## 2024-07-16 NOTE — Assessment & Plan Note (Signed)
"  Continue CPAP nightly         "

## 2024-07-16 NOTE — Assessment & Plan Note (Signed)
 Following with infectious disease. Continue Truvada 200-300 mg daily.

## 2024-07-16 NOTE — Assessment & Plan Note (Signed)
 Likely secondary to reflux.  Will start famotidine 20 mg HS. Consider PPI if no improvement. Avoid trigger foods.

## 2024-07-17 ENCOUNTER — Ambulatory Visit: Payer: Self-pay | Admitting: Primary Care

## 2024-07-17 ENCOUNTER — Ambulatory Visit

## 2024-07-17 DIAGNOSIS — R7989 Other specified abnormal findings of blood chemistry: Secondary | ICD-10-CM

## 2024-07-17 LAB — T4, FREE: Free T4: 0.77 ng/dL (ref 0.60–1.60)

## 2025-01-13 ENCOUNTER — Other Ambulatory Visit
